# Patient Record
Sex: Male | Born: 1960 | Race: Asian | Hispanic: No | Marital: Married | Smoking: Never smoker
Health system: Southern US, Community
[De-identification: ages and names within clinical notes are randomized; demographics above are authoritative.]

## PROBLEM LIST (undated history)

## (undated) DIAGNOSIS — I10 Essential (primary) hypertension: Secondary | ICD-10-CM

## (undated) DIAGNOSIS — Z87442 Personal history of urinary calculi: Secondary | ICD-10-CM

## (undated) HISTORY — DX: Essential (primary) hypertension: I10

---

## 1984-12-17 HISTORY — PX: APPENDECTOMY: SHX54

## 2017-02-08 LAB — HM COLONOSCOPY

## 2020-04-21 DIAGNOSIS — M25561 Pain in right knee: Secondary | ICD-10-CM | POA: Diagnosis not present

## 2020-04-21 DIAGNOSIS — I10 Essential (primary) hypertension: Secondary | ICD-10-CM | POA: Diagnosis not present

## 2020-09-07 ENCOUNTER — Telehealth: Payer: Self-pay | Admitting: Family Medicine

## 2020-09-07 NOTE — Telephone Encounter (Signed)
Dr. Jyl Heinz is referring Fredin to you as a new patient.   Please advise

## 2020-09-07 NOTE — Telephone Encounter (Signed)
OK to sched, Oct 1 or after. Ty.

## 2020-09-27 ENCOUNTER — Encounter: Payer: Self-pay | Admitting: Family Medicine

## 2020-09-27 ENCOUNTER — Ambulatory Visit: Payer: BC Managed Care – PPO | Admitting: Family Medicine

## 2020-09-27 ENCOUNTER — Other Ambulatory Visit: Payer: Self-pay

## 2020-09-27 VITALS — BP 142/90 | HR 72 | Temp 98.5°F | Ht 71.0 in | Wt 204.5 lb

## 2020-09-27 DIAGNOSIS — I10 Essential (primary) hypertension: Secondary | ICD-10-CM

## 2020-09-27 DIAGNOSIS — Z23 Encounter for immunization: Secondary | ICD-10-CM | POA: Diagnosis not present

## 2020-09-27 MED ORDER — CARVEDILOL 25 MG PO TABS: 25.0000 mg | ORAL_TABLET | Freq: Two times a day (BID) | ORAL | 3 refills | Status: DC

## 2020-09-27 MED ORDER — AMLODIPINE-VALSARTAN-HCTZ 10-320-25 MG PO TABS: 1.0000 | ORAL_TABLET | Freq: Every day | ORAL | 2 refills | Status: DC

## 2020-09-27 NOTE — Patient Instructions (Signed)
Keep the diet clean and stay active.  Around 3 times per week, check your blood pressure 4 times per day. Twice in the morning and twice in the evening. The readings should be at least one minute apart. Write down these values and bring them to your next nurse visit/appointment.  When you check your BP, make sure you have been doing something calm/relaxing 5 minutes prior to checking. Both feet should be flat on the floor and you should be sitting. Use your left arm and make sure it is in a relaxed position (on a table), and that the cuff is at the approximate level/height of your heart.  Aim to do some physical exertion for 150 minutes per week. This is typically divided into 5 days per week, 30 minutes per day. The activity should be enough to get your heart rate up. Anything is better than nothing if you have time constraints.  Let us know if you need anything.

## 2020-09-27 NOTE — Progress Notes (Signed)
Chief Complaint  Patient presents with  . New Patient (Initial Visit)       New Patient Visit SUBJECTIVE: HPI: Roberto Martinez is an 59 y.o.male who is being seen for establishing care.  The patient was previously seen at [facility], doc retired.  Hypertension Patient presents for hypertension follow up. He does not routinely monitor home blood pressures. He is compliant with medications- Exforge HCT 10-320-25 mg/d, Toprol XL 50 mg/d, felodipine 5 mg/d. Patient has these side effects of medication: felodipine made him urinate freq He is adhering to a healthy diet overall. Exercise: walking 1 mi/d  Past Medical History:  Diagnosis Date  . Hypertension    Past Surgical History:  Procedure Laterality Date  . APPENDECTOMY  1986   Family History  Problem Relation Age of Onset  . Early death Mother   . Hypertension Father   . Cancer Sister        breast cancer  . Cancer Brother    No Known Allergies  Current Outpatient Medications:  .  amLODIPine-Valsartan-HCTZ 10-320-25 MG TABS, Take 1 tablet by mouth daily., Disp: 90 tablet, Rfl: 2 .  carvedilol (COREG) 25 MG tablet, Take 1 tablet (25 mg total) by mouth 2 (two) times daily with a meal., Disp: 60 tablet, Rfl: 3  OBJECTIVE: BP (!) 142/90 (BP Location: Right Arm, Patient Position: Sitting, Cuff Size: Normal)   Pulse 72   Temp 98.5 F (36.9 C) (Oral)   Ht 5\' 11"  (1.803 m)   Wt 204 lb 8 oz (92.8 kg)   SpO2 98%   BMI 28.52 kg/m  General:  well developed, well nourished, in no apparent distress Skin:  no significant moles, warts, or growths Lungs:  clear to auscultation, breath sounds equal bilaterally, no respiratory distress Cardio:  regular rate and rhythm, no LE edema or bruits Neuro:  gait normal Psych: well oriented with normal range of affect and appropriate judgment/insight  ASSESSMENT/PLAN: Essential hypertension - Plan: amLODIPine-Valsartan-HCTZ 10-320-25 MG TABS, carvedilol (COREG) 25 MG tablet  Need for influenza  vaccination - Plan: Flu Vaccine QUAD 6+ mos PF IM (Fluarix Quad PF)  Need for Tdap vaccination - Plan: Tdap vaccine greater than or equal to 7yo IM  1. Status: Uncontrolled. Cont Exforge HCT, change Toprol to Coreg 25 mg/d. Stop Plendil 2/2 AE's. Counseled on diet/exercise. Monitor BP at home.  Update imms.  Patient should return in 1 mo for CPE and reck BP. The patient voiced understanding and agreement to the plan.   [place], DO 09/27/20  1:33 PM

## 2020-10-17 ENCOUNTER — Other Ambulatory Visit: Payer: Self-pay | Admitting: Family Medicine

## 2020-10-17 DIAGNOSIS — I10 Essential (primary) hypertension: Secondary | ICD-10-CM

## 2020-10-31 ENCOUNTER — Ambulatory Visit (INDEPENDENT_AMBULATORY_CARE_PROVIDER_SITE_OTHER): Payer: BC Managed Care – PPO | Admitting: Family Medicine

## 2020-10-31 ENCOUNTER — Encounter: Payer: Self-pay | Admitting: Family Medicine

## 2020-10-31 ENCOUNTER — Other Ambulatory Visit: Payer: Self-pay

## 2020-10-31 VITALS — BP 126/68 | HR 67 | Temp 98.4°F | Resp 12 | Ht 71.0 in | Wt 208.4 lb

## 2020-10-31 DIAGNOSIS — Z125 Encounter for screening for malignant neoplasm of prostate: Secondary | ICD-10-CM | POA: Diagnosis not present

## 2020-10-31 DIAGNOSIS — Z0000 Encounter for general adult medical examination without abnormal findings: Secondary | ICD-10-CM

## 2020-10-31 DIAGNOSIS — I10 Essential (primary) hypertension: Secondary | ICD-10-CM | POA: Diagnosis not present

## 2020-10-31 LAB — LIPID PANEL
Cholesterol: 140 mg/dL (ref 0–200)
HDL: 40.9 mg/dL (ref >39.00)
LDL Cholesterol: 76 mg/dL (ref 0–99)
NonHDL: 99.49
Total CHOL/HDL Ratio: 3
Triglycerides: 116 mg/dL (ref 0.0–149.0)
VLDL: 23.2 mg/dL (ref 0.0–40.0)

## 2020-10-31 LAB — COMPREHENSIVE METABOLIC PANEL
ALT: 13 U/L (ref 0–53)
AST: 14 U/L (ref 0–37)
Albumin: 4.1 g/dL (ref 3.5–5.2)
Alkaline Phosphatase: 60 U/L (ref 39–117)
BUN: 10 mg/dL (ref 6–23)
CO2: 30 mEq/L (ref 19–32)
Calcium: 8.9 mg/dL (ref 8.4–10.5)
Chloride: 99 mEq/L (ref 96–112)
Creatinine, Ser: 1 mg/dL (ref 0.40–1.50)
GFR: 82.72 mL/min (ref >60.00)
Glucose, Bld: 93 mg/dL (ref 70–99)
Potassium: 3.6 mEq/L (ref 3.5–5.1)
Sodium: 138 mEq/L (ref 135–145)
Total Bilirubin: 0.8 mg/dL (ref 0.2–1.2)
Total Protein: 6.7 g/dL (ref 6.0–8.3)

## 2020-10-31 LAB — PSA: PSA: 0.66 ng/mL (ref 0.10–4.00)

## 2020-10-31 MED ORDER — CARVEDILOL 25 MG PO TABS: 25.0000 mg | ORAL_TABLET | Freq: Two times a day (BID) | ORAL | 2 refills | Status: DC

## 2020-10-31 MED ORDER — AMLODIPINE BESYLATE 5 MG PO TABS: 5.0000 mg | ORAL_TABLET | Freq: Every day | ORAL | 2 refills | Status: DC

## 2020-10-31 MED ORDER — VALSARTAN-HYDROCHLOROTHIAZIDE 160-25 MG PO TABS: 1.0000 | ORAL_TABLET | Freq: Every day | ORAL | 2 refills | Status: DC

## 2020-10-31 NOTE — Patient Instructions (Addendum)
Give [place] 2-3 business days to get the results of your labs back.   Keep the diet clean and stay active.  Consider GoodRx.   Let us know if you need anything.

## 2020-10-31 NOTE — Progress Notes (Signed)
Chief Complaint  Patient presents with  . Annual Exam    Well Male [place] is here for a complete physical.   His last physical was >1 year ago.  Current diet: in general, a "healthy" diet.  Current exercise: walking Weight trend: stable Fatigue out of ordinary? No. Seat belt? Yes.    Health maintenance Shingrix- Yes Colonoscopy- Yes Tetanus- Yes HIV- No Hep C- No   Past Medical History:  Diagnosis Date  . Hypertension       Past Surgical History:  Procedure Laterality Date  . APPENDECTOMY  1986    Medications  Current Outpatient Medications on File Prior to Visit  Medication Sig Dispense Refill  . carvedilol (COREG) 25 MG tablet Take 1 tablet (25 mg total) by mouth 2 (two) times daily with a meal. 60 tablet 3  . felodipine (PLENDIL) 5 MG 24 hr tablet Take 1 tablet by mouth daily.    . valsartan-hydrochlorothiazide (DIOVAN-HCT) 160-25 MG tablet Take 1 tablet by mouth daily. 30 tablet 0   Allergies No Known Allergies  Family History Family History  Problem Relation Age of Onset  . Early death Mother   . Hypertension Father   . Cancer Sister        breast cancer  . Cancer Brother     Review of Systems: Constitutional:  no fevers Eye:  no recent significant change in vision Ear/Nose/Mouth/Throat:  Ears:  no hearing loss Nose/Mouth/Throat:  no complaints of nasal congestion, no sore throat Cardiovascular:  no chest pain Respiratory:  no shortness of breath Gastrointestinal:  no change in bowel habits GU:  Male: negative for dysuria, frequency Musculoskeletal/Extremities:  no joint pain Integumentary (Skin/Breast):  no abnormal skin lesions reported Neurologic:  no headaches Endocrine: No unexpected weight changes Hematologic/Lymphatic:  no abnormal bleeding  Exam BP 126/68 (BP Location: Left Arm, Cuff Size: Large)   Pulse 67   Temp 98.4 F (36.9 C) (Oral)   Resp 12   Ht 5\' 11"  (1.803 m)   Wt 208 lb 6.4 oz (94.5 kg)   SpO2 97%   BMI 29.07  kg/m  General:  well developed, well nourished, in no apparent distress Skin:  no significant moles, warts, or growths Head:  no masses, lesions, or tenderness Eyes:  pupils equal and round, sclera anicteric without injection Ears:  canals without lesions, TMs shiny without retraction, no obvious effusion, no erythema Nose:  nares patent, septum midline, mucosa normal Throat/Pharynx:  lips and gingiva without lesion; tongue and uvula midline; non-inflamed pharynx; no exudates or postnasal drainage Neck: neck supple without adenopathy, thyromegaly, or masses Cardiac: RRR, no bruits, no LE edema Lungs:  clear to auscultation, breath sounds equal bilaterally, no respiratory distress Rectal: Deferred Musculoskeletal:  symmetrical muscle groups noted without atrophy or deformity Neuro:  gait normal; deep tendon reflexes normal and symmetric Psych: well oriented with normal range of affect and appropriate judgment/insight  Assessment and Plan  Well adult exam - Plan: Comprehensive metabolic panel, Lipid panel  Essential hypertension - Plan: valsartan-hydrochlorothiazide (DIOVAN-HCT) 160-25 MG tablet, carvedilol (COREG) 25 MG tablet  Screening for prostate cancer - Plan: PSA   Well 59 y.o. male. Counseled on diet and exercise. Counseled on risks and benefits of prostate cancer screening with PSA. The patient agrees to undergo testing. Immunizations, labs, and further orders as above. Follow up in 6 mo or prn. The patient voiced understanding and agreement to the plan.  [place], DO 10/31/20 9:25 AM

## 2021-05-01 ENCOUNTER — Ambulatory Visit: Payer: BC Managed Care – PPO | Admitting: Family Medicine

## 2021-05-01 ENCOUNTER — Encounter: Payer: Self-pay | Admitting: Family Medicine

## 2021-05-01 ENCOUNTER — Other Ambulatory Visit: Payer: Self-pay

## 2021-05-01 VITALS — BP 148/60 | HR 58 | Temp 98.0°F | Ht 71.0 in | Wt 206.2 lb

## 2021-05-01 DIAGNOSIS — M25561 Pain in right knee: Secondary | ICD-10-CM

## 2021-05-01 DIAGNOSIS — G8929 Other chronic pain: Secondary | ICD-10-CM

## 2021-05-01 DIAGNOSIS — M25562 Pain in left knee: Secondary | ICD-10-CM | POA: Diagnosis not present

## 2021-05-01 DIAGNOSIS — I10 Essential (primary) hypertension: Secondary | ICD-10-CM | POA: Diagnosis not present

## 2021-05-01 MED ORDER — AMLODIPINE BESYLATE 10 MG PO TABS
10.0000 mg | ORAL_TABLET | Freq: Every day | ORAL | 2 refills | Status: DC
Start: 2021-05-01 — End: 2021-05-10

## 2021-05-01 NOTE — Patient Instructions (Addendum)
Take 2 tabs of amlodipine until you run out. A new dosage has been sent.   Keep the diet clean and stay active.  Check your blood pressures 2-3 times per week, alternating the time of day you check it. If it is high, considering waiting 1-2 minutes and rechecking. If it gets higher, your anxiety is likely creeping up and we should avoid rechecking.   Ice/cold pack over area for 10-15 min twice daily.  Heat (pad or rice pillow in microwave) over affected area, 10-15 minutes twice daily.   OK to take Tylenol 1000 mg (2 extra strength tabs) or 975 mg (3 regular strength tabs) every 6 hours as needed.  Consider Powerstep insoles. There are very quality over the counter inserts. Shop around online and in stores. Dr. Felicie Morn is a cheaper alternative, though is not as high of quality.   Let us know if you need anything.  Knee Exercises It is normal to feel mild stretching, pulling, tightness, or discomfort as you do these exercises, but you should stop right away if you feel sudden pain or your pain gets worse. STRETCHING AND RANGE OF MOTION EXERCISES  These exercises warm up your muscles and joints and improve the movement and flexibility of your knee. These exercises also help to relieve pain, numbness, and tingling. Exercise A: Knee Extension, Prone  1. Lie on your abdomen on a bed. 2. Place your left / right knee just beyond the edge of the surface so your knee is not on the bed. You can put a towel under your left / right thigh just above your knee for comfort. 3. Relax your leg muscles and allow gravity to straighten your knee. You should feel a stretch behind your left / right knee. 4. Hold this position for 30 seconds. 5. Scoot up so your knee is supported between repetitions. Repeat 2 times. Complete this stretch 3 times per week. Exercise B: Knee Flexion, Active    1. Lie on your back with both knees straight. If this causes back discomfort, bend your left / right knee so your  foot is flat on the floor. 2. Slowly slide your left / right heel back toward your buttocks until you feel a gentle stretch in the front of your knee or thigh. 3. Hold this position for 30 seconds. 4. Slowly slide your left / right heel back to the starting position. Repeat 2 times. Complete this exercise 3 times per week. Exercise C: Quadriceps, Prone    1. Lie on your abdomen on a firm surface, such as a bed or padded floor. 2. Bend your left / right knee and hold your ankle. If you cannot reach your ankle or pant leg, loop a belt around your foot and grab the belt instead. 3. Gently pull your heel toward your buttocks. Your knee should not slide out to the side. You should feel a stretch in the front of your thigh and knee. 4. Hold this position for 30 seconds. Repeat 2 times. Complete this stretch 3 times per week. Exercise D: Hamstring, Supine  1. Lie on your back. 2. Loop a belt or towel over the ball of your left / right foot. The ball of your foot is on the walking surface, right under your toes. 3. Straighten your left / right knee and slowly pull on the belt to raise your leg until you feel a gentle stretch behind your knee. ? Do not let your left / right knee bend while you do  this. ? Keep your other leg flat on the floor. 4. Hold this position for 30 seconds. Repeat 2 times. Complete this stretch 3 times per week. STRENGTHENING EXERCISES  These exercises build strength and endurance in your knee. Endurance is the ability to use your muscles for a long time, even after they get tired. Exercise E: Quadriceps, Isometric    1. Lie on your back with your left / right leg extended and your other knee bent. Put a rolled towel or small pillow under your knee if told by your health care provider. 2. Slowly tense the muscles in the front of your left / right thigh. You should see your kneecap slide up toward your hip or see increased dimpling just above the knee. This motion will push  the back of the knee toward the floor. 3. For 3 seconds, keep the muscle as tight as you can without increasing your pain. 4. Relax the muscles slowly and completely. Repeat for 10 total reps Repeat 2 ti mes. Complete this exercise 3 times per week. Exercise F: Straight Leg Raises - Quadriceps  1. Lie on your back with your left / right leg extended and your other knee bent. 2. Tense the muscles in the front of your left / right thigh. You should see your kneecap slide up or see increased dimpling just above the knee. Your thigh may even shake a bit. 3. Keep these muscles tight as you raise your leg 4-6 inches (10-15 cm) off the floor. Do not let your knee bend. 4. Hold this position for 3 seconds. 5. Keep these muscles tense as you lower your leg. 6. Relax your muscles slowly and completely after each repetition. 10 total reps. Repeat 2 times. Complete this exercise 3 times per week.  Exercise G: Hamstring Curls    If told by your health care provider, do this exercise while wearing ankle weights. Begin with 5 lb weights (optional). Then increase the weight by 1 lb (0.5 kg) increments. Do not wear ankle weights that are more than 20 lbs to start with. 1. Lie on your abdomen with your legs straight. 2. Bend your left / right knee as far as you can without feeling pain. Keep your hips flat against the floor. 3. Hold this position for 3 seconds. 4. Slowly lower your leg to the starting position. Repeat for 10 reps.  Repeat 2 times. Complete this exercise 3 times per week. Exercise H: Squats (Quadriceps)  1. Stand in front of a table, with your feet and knees pointing straight ahead. You may rest your hands on the table for balance but not for support. 2. Slowly bend your knees and lower your hips like you are going to sit in a chair. ? Keep your weight over your heels, not over your toes. ? Keep your lower legs upright so they are parallel with the table legs. ? Do not let your hips go  lower than your knees. ? Do not bend lower than told by your health care provider. ? If your knee pain increases, do not bend as low. 3. Hold the squat position for 1 second. 4. Slowly push with your legs to return to standing. Do not use your hands to pull yourself to standing. Repeat 2 times. Complete this exercise 3 times per week. Exercise I: Wall Slides (Quadriceps)    1. Lean your back against a smooth wall or door while you walk your feet out 18-24 inches (46-61 cm) from it. 2. Place  your feet hip-width apart. 3. Slowly slide down the wall or door until your knees Repeat 2 times. Complete this exercise every other day. 4. Exercise K: Straight Leg Raises - Hip Abductors  1. Lie on your side with your left / right leg in the top position. Lie so your head, shoulder, knee, and hip line up. You may bend your bottom knee to help you keep your balance. 2. Roll your hips slightly forward so your hips are stacked directly over each other and your left / right knee is facing forward. 3. Leading with your heel, lift your top leg 4-6 inches (10-15 cm). You should feel the muscles in your outer hip lifting. ? Do not let your foot drift forward. ? Do not let your knee roll toward the ceiling. 4. Hold this position for 3 seconds. 5. Slowly return your leg to the starting position. 6. Let your muscles relax completely after each repetition. 10 total reps. Repeat 2 times. Complete this exercise 3 times per week. Exercise J: Straight Leg Raises - Hip Extensors  1. Lie on your abdomen on a firm surface. You can put a pillow under your hips if that is more comfortable. 2. Tense the muscles in your buttocks and lift your left / right leg about 4-6 inches (10-15 cm). Keep your knee straight as you lift your leg. 3. Hold this position for 3 seconds. 4. Slowly lower your leg to the starting position. 5. Let your leg relax completely after each repetition. Repeat 2 times. Complete this exercise 3 times  per week. Document Released: 10/17/2005 Document Revised: 08/27/2016 Document Reviewed: 10/09/2015 Elsevier Interactive Patient Education  2017 [employer].

## 2021-05-01 NOTE — Progress Notes (Signed)
Chief Complaint  Patient presents with  . Medical Management of Chronic Issues    6 m f/u - leg and knee pain getting worse     Subjective Roberto Martinez is a 60 y.o. male who presents for hypertension follow up. He does monitor home blood pressures. Blood pressures ranging from 140's/80's on average. He is compliant with medications- Norvasc 5 mg/d, Coreg 25 mg bid, Diovan-HCT 160-25 mg/d. Patient has these side effects of medication: none He is not adhering to a healthy diet overall. Current exercise: walking No CP or SOB.   Bilateral knee pain 4-5 mo of bilateral knee pain. No inj or change in activity. Sometimes it will get caught/lock. No neuro s/s's. Has not tried anything thus far. Dad had OA in his knees. It will sometimes swell, no redness or bruising.    Past Medical History:  Diagnosis Date  . Hypertension     Exam BP (!) 148/60   Pulse (!) 58   Temp 98 F (36.7 C) (Oral)   Ht 5\' 11"  (1.803 m)   Wt 206 lb 3.2 oz (93.5 kg)   SpO2 97%   BMI 28.76 kg/m  General:  well developed, well nourished, in no apparent distress Heart: RRR, no bruits, no LE edema Lungs: clear to auscultation, no accessory muscle use MSK: no ttp of knees, no effusion or deformity; neg McMurray's, pat app/grind, Lachman's, Stine's, varus/valgus stress b/l.  Psych: well oriented with normal range of affect and appropriate judgment/insight  Essential hypertension  Chronic pain of both knees  1.  Chronic, uncontrolled.  Increase dosage of Norvasc from 5 mg daily to 10 mg daily.  Continue Coreg 25 mg twice daily and Diovan-HCT 160-25 mg daily.  Continue to monitor blood pressure at home.  Counseled on diet and exercise.  I will see him in 6 weeks for this. 2.  Chronic, uncontrolled.  He has flat feet.  Recommended arch support.  If no improvement in the next few weeks, he will start stretches and exercises to rehab both knees.  We will consider injections versus referral to sports medicine in 6  weeks if no improvement. The patient voiced understanding and agreement to the plan.  [place], DO 05/01/21  4:33 PM

## 2021-05-07 ENCOUNTER — Encounter (HOSPITAL_BASED_OUTPATIENT_CLINIC_OR_DEPARTMENT_OTHER): Payer: Self-pay | Admitting: Emergency Medicine

## 2021-05-07 ENCOUNTER — Emergency Department (HOSPITAL_BASED_OUTPATIENT_CLINIC_OR_DEPARTMENT_OTHER): Payer: BC Managed Care – PPO

## 2021-05-07 ENCOUNTER — Emergency Department (HOSPITAL_BASED_OUTPATIENT_CLINIC_OR_DEPARTMENT_OTHER)
Admission: EM | Admit: 2021-05-07 | Discharge: 2021-05-07 | Disposition: A | Discharge status: Home / Self Care | Payer: BC Managed Care – PPO | Attending: Emergency Medicine | Admitting: Emergency Medicine

## 2021-05-07 ENCOUNTER — Other Ambulatory Visit: Payer: Self-pay

## 2021-05-07 DIAGNOSIS — N400 Enlarged prostate without lower urinary tract symptoms: Secondary | ICD-10-CM | POA: Diagnosis not present

## 2021-05-07 DIAGNOSIS — I10 Essential (primary) hypertension: Secondary | ICD-10-CM | POA: Diagnosis not present

## 2021-05-07 DIAGNOSIS — K3189 Other diseases of stomach and duodenum: Secondary | ICD-10-CM | POA: Diagnosis not present

## 2021-05-07 DIAGNOSIS — R9431 Abnormal electrocardiogram [ECG] [EKG]: Secondary | ICD-10-CM

## 2021-05-07 DIAGNOSIS — R011 Cardiac murmur, unspecified: Secondary | ICD-10-CM | POA: Diagnosis not present

## 2021-05-07 DIAGNOSIS — R112 Nausea with vomiting, unspecified: Secondary | ICD-10-CM | POA: Insufficient documentation

## 2021-05-07 DIAGNOSIS — Z79899 Other long term (current) drug therapy: Secondary | ICD-10-CM | POA: Diagnosis not present

## 2021-05-07 DIAGNOSIS — R1031 Right lower quadrant pain: Secondary | ICD-10-CM

## 2021-05-07 DIAGNOSIS — N289 Disorder of kidney and ureter, unspecified: Secondary | ICD-10-CM | POA: Insufficient documentation

## 2021-05-07 DIAGNOSIS — R109 Unspecified abdominal pain: Secondary | ICD-10-CM | POA: Diagnosis not present

## 2021-05-07 DIAGNOSIS — N2889 Other specified disorders of kidney and ureter: Secondary | ICD-10-CM

## 2021-05-07 DIAGNOSIS — N401 Enlarged prostate with lower urinary tract symptoms: Secondary | ICD-10-CM | POA: Diagnosis not present

## 2021-05-07 DIAGNOSIS — K6389 Other specified diseases of intestine: Secondary | ICD-10-CM | POA: Diagnosis not present

## 2021-05-07 LAB — COMPREHENSIVE METABOLIC PANEL
ALT: 22 U/L (ref 0–44)
AST: 21 U/L (ref 15–41)
Albumin: 4.5 g/dL (ref 3.5–5.0)
Alkaline Phosphatase: 70 U/L (ref 38–126)
Anion gap: 12 (ref 5–15)
BUN: 9 mg/dL (ref 6–20)
CO2: 27 mmol/L (ref 22–32)
Calcium: 9.6 mg/dL (ref 8.9–10.3)
Chloride: 97 mmol/L — ABNORMAL LOW (ref 98–111)
Creatinine, Ser: 0.89 mg/dL (ref 0.61–1.24)
GFR, Estimated: >60 mL/min (ref >60)
Glucose, Bld: 124 mg/dL — ABNORMAL HIGH (ref 70–99)
Potassium: 2.9 mmol/L — ABNORMAL LOW (ref 3.5–5.1)
Sodium: 136 mmol/L (ref 135–145)
Total Bilirubin: 1.4 mg/dL — ABNORMAL HIGH (ref 0.3–1.2)
Total Protein: 8.4 g/dL — ABNORMAL HIGH (ref 6.5–8.1)

## 2021-05-07 LAB — URINALYSIS, ROUTINE W REFLEX MICROSCOPIC
Bilirubin Urine: NEGATIVE
Glucose, UA: NEGATIVE mg/dL
Hgb urine dipstick: NEGATIVE
Ketones, ur: NEGATIVE mg/dL
Leukocytes,Ua: NEGATIVE
Nitrite: NEGATIVE
Protein, ur: NEGATIVE mg/dL
Specific Gravity, Urine: 1.02 (ref 1.005–1.030)
pH: 7.5 (ref 5.0–8.0)

## 2021-05-07 LAB — CBC
HCT: 46.1 % (ref 39.0–52.0)
Hemoglobin: 14.6 g/dL (ref 13.0–17.0)
MCH: 24 pg — ABNORMAL LOW (ref 26.0–34.0)
MCHC: 31.7 g/dL (ref 30.0–36.0)
MCV: 75.8 fL — ABNORMAL LOW (ref 80.0–100.0)
Platelets: 281 10*3/uL (ref 150–400)
RBC: 6.08 MIL/uL — ABNORMAL HIGH (ref 4.22–5.81)
RDW: 16.8 % — ABNORMAL HIGH (ref 11.5–15.5)
WBC: 13 10*3/uL — ABNORMAL HIGH (ref 4.0–10.5)
nRBC: 0 % (ref 0.0–0.2)

## 2021-05-07 LAB — LIPASE, BLOOD: Lipase: 34 U/L (ref 11–51)

## 2021-05-07 LAB — MAGNESIUM: Magnesium: 2.1 mg/dL (ref 1.7–2.4)

## 2021-05-07 MED ORDER — POTASSIUM CHLORIDE CRYS ER 20 MEQ PO TBCR
40.0000 meq | EXTENDED_RELEASE_TABLET | Freq: Once | ORAL | Status: AC
  Administered 2021-05-07: 40 meq via ORAL
  Filled 2021-05-07: qty 2

## 2021-05-07 MED ORDER — ONDANSETRON 4 MG PO TBDP: 4.0000 mg | ORAL_TABLET | Freq: Three times a day (TID) | ORAL | 0 refills | Status: DC | PRN

## 2021-05-07 MED ORDER — ONDANSETRON HCL 4 MG/2ML IJ SOLN
4.0000 mg | Freq: Once | INTRAMUSCULAR | Status: AC
  Administered 2021-05-07: 4 mg via INTRAVENOUS
  Filled 2021-05-07: qty 2

## 2021-05-07 MED ORDER — SODIUM CHLORIDE 0.9 % IV BOLUS
500.0000 mL | Freq: Once | INTRAVENOUS | Status: AC
  Administered 2021-05-07: 500 mL via INTRAVENOUS

## 2021-05-07 MED ORDER — MORPHINE SULFATE (PF) 4 MG/ML IV SOLN
4.0000 mg | Freq: Once | INTRAVENOUS | Status: AC
Start: 2021-05-07 — End: 2021-05-07
  Administered 2021-05-07: 4 mg via INTRAVENOUS
  Filled 2021-05-07: qty 1

## 2021-05-07 MED ORDER — POTASSIUM CHLORIDE ER 10 MEQ PO TBCR: 20.0000 meq | EXTENDED_RELEASE_TABLET | Freq: Every day | ORAL | 0 refills | Status: DC

## 2021-05-07 MED ORDER — POTASSIUM CHLORIDE 10 MEQ/100ML IV SOLN
10.0000 meq | Freq: Once | INTRAVENOUS | Status: AC
  Administered 2021-05-07: 10 meq via INTRAVENOUS
  Filled 2021-05-07: qty 100

## 2021-05-07 MED ORDER — DICYCLOMINE HCL 20 MG PO TABS: 20.0000 mg | ORAL_TABLET | Freq: Two times a day (BID) | ORAL | 0 refills | Status: DC

## 2021-05-07 NOTE — Discharge Instructions (Addendum)
Your work-up today was quite reassuring.  Notably your potassium was somewhat low with a number of 2.9 today.  I have given you 2 days of potassium to take.  You will need to schedule a follow-up appointment with your primary care provider later this week to recheck your electrolytes.  On my examination I did hear a small murmur.  It is reasonable to follow-up with your cardiologist regarding this.  Alternatively your primary care provider may be able to order this ultrasound during your follow-up appointment.  Which can then be reviewed by cardiology.  As we discussed there is a small lesion on your right kidney which will need follow-up by your primary care provider as well.  I have attached the printout of your radiology report for your review and for your primary care provider to review.  Lastly, your EKG was initially somewhat abnormal however the repeat EKG was reassuring. Ultimately I suspect this is unrelated to the symptoms you are experiencing today but does merit discussion with your cardiologist.   For your symptoms today given that you have had a significant improvement with the medicine here and I have prescribed you Bentyl for pain, Zofran for nausea and the potassium we discussed earlier.

## 2021-05-07 NOTE — ED Notes (Signed)
Delivered EKG to Dr. Melina Copa at 1524.

## 2021-05-07 NOTE — ED Provider Notes (Signed)
[facility] Provider Note   CSN: [id number] Arrival date & time: 05/07/21  1419     History Chief Complaint  Patient presents with  . Abdominal Pain    Roberto Martinez is a 60 y.o. male.  HPI Patient is a 60 year old male with past medical history significant for hypertension on medication, past surgical history notable for appendectomy.  Patient is presented today with nausea, vomiting, abdominal pain.  He states that his symptoms began this morning.  He states that he woke up without any pain and got up and walked around start having some achy right lower quadrant abdominal pain.  He states he went to the bathroom and had a normal bowel movement with no blood melena or hematochezia.  He states that he started having worse pain as the day progressed.  An hour or 2 after the pain began he had 1 episode of nonbloody nonbilious emesis.  Since that time he has had progressively worsening abdominal pain.  No chest pain or shortness of breath.  He denies any cough congestion fevers or chills.  Denies any urinary frequency urgency or dysuria.  He states he has had a kidney stone in the past that felt somewhat similar to this.  He denies any other associate symptoms.  No aggravating mitigating factors.      Past Medical History:  Diagnosis Date  . Hypertension     There are no problems to display for this patient.   Past Surgical History:  Procedure Laterality Date  . APPENDECTOMY  1986       Family History  Problem Relation Age of Onset  . Early death Mother   . Hypertension Father   . Cancer Sister        breast cancer  . Cancer Brother     Social History   Tobacco Use  . Smoking status: Never Smoker  . Smokeless tobacco: Never Used  Substance Use Topics  . Alcohol use: Never  . Drug use: Never    Home Medications Prior to Admission medications   Medication Sig Start Date End Date Taking? Authorizing Provider  dicyclomine  (BENTYL) 20 MG tablet Take 1 tablet (20 mg total) by mouth 2 (two) times daily. 05/07/21  Yes Makayle Krahn S, PA  ondansetron (ZOFRAN ODT) 4 MG disintegrating tablet Take 1 tablet (4 mg total) by mouth every 8 (eight) hours as needed for nausea or vomiting. 05/07/21  Yes Zoey Gilkeson S, PA  potassium chloride (KLOR-CON) 10 MEQ tablet Take 2 tablets (20 mEq total) by mouth daily. 05/07/21  Yes Jerrold Haskell S, PA  amLODipine (NORVASC) 10 MG tablet Take 1 tablet (10 mg total) by mouth daily. 05/01/21   Shelda Pal, DO  carvedilol (COREG) 25 MG tablet Take 1 tablet (25 mg total) by mouth 2 (two) times daily with a meal. 10/31/20   Wendling, Crosby Oyster, DO  cyanocobalamin 1000 MCG tablet Take 1 tablet by mouth daily. 11/06/19   [provider]  valsartan-hydrochlorothiazide (DIOVAN-HCT) 160-25 MG tablet Take 1 tablet by mouth daily. 10/31/20   Shelda Pal, DO    Allergies    Patient has no known allergies.  Review of Systems   Review of Systems  Constitutional: Positive for fatigue. Negative for chills and fever.  HENT: Negative for congestion.   Eyes: Negative for pain.  Respiratory: Negative for cough and shortness of breath.   Cardiovascular: Negative for chest pain and leg swelling.  Gastrointestinal: Positive for abdominal pain, nausea  and vomiting. Negative for diarrhea.  Genitourinary: Negative for dysuria.  Musculoskeletal: Negative for myalgias.  Skin: Negative for rash.  Neurological: Negative for dizziness and headaches.    Physical Exam Updated Vital Signs BP 136/82 (BP Location: Right Arm)   Pulse 64   Temp 98 F (36.7 C) (Oral)   Resp (!) 22   Ht 5\' 11"  (1.803 m)   Wt 93 kg   SpO2 99%   BMI 28.59 kg/m   Physical Exam Vitals and nursing note reviewed.  Constitutional:      General: He is not in acute distress.    Comments: Pleasant well-appearing 60 year old.  Uncomfortable appearing.  Able answer questions appropriately follow  commands. No increased work of breathing. Speaking in full sentences.  HENT:     Head: Normocephalic and atraumatic.     Nose: Nose normal.  Eyes:     General: No scleral icterus. Cardiovascular:     Rate and Rhythm: Normal rate and regular rhythm.     Pulses: Normal pulses.     Heart sounds: Murmur heard.    Pulmonary:     Effort: Pulmonary effort is normal. No respiratory distress.     Breath sounds: Normal breath sounds. No wheezing.  Abdominal:     Palpations: Abdomen is soft.     Tenderness: There is abdominal tenderness.     Comments: Abdomen is soft, tenderness to palpation of right lower quadrant.  No other focal abdominal tenderness.  No guarding or rebound.  No CVA tenderness.  No masses palpable.  Musculoskeletal:     Cervical back: Normal range of motion.     Right lower leg: No edema.     Left lower leg: No edema.  Skin:    General: Skin is warm and dry.     Capillary Refill: Capillary refill takes less than 2 seconds.  Neurological:     Mental Status: He is alert. Mental status is at baseline.  Psychiatric:        Mood and Affect: Mood normal.        Behavior: Behavior normal.     ED Results / Procedures / Treatments   Labs (all labs ordered are listed, but only abnormal results are displayed) Labs Reviewed  COMPREHENSIVE METABOLIC PANEL - Abnormal; Notable for the following components:      Result Value   Potassium 2.9 (*)    Chloride 97 (*)    Glucose, Bld 124 (*)    Total Protein 8.4 (*)    Total Bilirubin 1.4 (*)    All other components within normal limits  CBC - Abnormal; Notable for the following components:   WBC 13.0 (*)    RBC 6.08 (*)    MCV 75.8 (*)    MCH 24.0 (*)    RDW 16.8 (*)    All other components within normal limits  LIPASE, BLOOD  URINALYSIS, ROUTINE W REFLEX MICROSCOPIC  MAGNESIUM    EKG EKG Interpretation  Date/Time:  Sunday May 07 2021 15:25:13 EDT Ventricular Rate:  61 PR Interval:  174 QRS Duration: 104 QT  Interval:  419 QTC Calculation: 422 R Axis:   7 Text Interpretation: Sinus rhythm Atrial premature complex Abnormal R-wave progression, early transition No significant change since Confirmed by Deno Etienne [phone]) on 05/07/2021 3:52:57 PM Also confirmed by Deno Etienne [phone]), editor Hattie Perch [phone])  on 05/07/2021 4:41:01 PM   Radiology CT ABDOMEN PELVIS WO CONTRAST  Result Date: 05/07/2021 CLINICAL DATA:  60 year old male with acute RIGHT  abdominal and pelvic pain. EXAM: CT ABDOMEN AND PELVIS WITHOUT CONTRAST TECHNIQUE: Multidetector CT imaging of the abdomen and pelvis was performed following the standard protocol without IV contrast. COMPARISON:  None. FINDINGS: Please note that parenchymal abnormalities may be missed without intravenous contrast. Lower chest: No acute abnormality. Hepatobiliary: Mild hepatic steatosis is noted. No focal hepatic abnormalities are identified. The gallbladder is unremarkable. No biliary dilatation. Pancreas: Unremarkable Spleen: Unremarkable Adrenals/Urinary Tract: A 2 cm mass extending off of the anterior mid-upper RIGHT kidney measures 30 field Hounsfield units and suspicious for solid mass (series 2: Image 32). No other renal abnormalities are identified. No hydronephrosis or urinary calculi identified. The adrenal glands and bladder are unremarkable. Stomach/Bowel: There are multiple dilated proximal and mid small bowel loops. Distal small bowel loops are less distended but there is no definite transition point noted. No bowel wall thickening is noted. Vascular/Lymphatic: No significant vascular findings are present. No enlarged abdominal or pelvic lymph nodes. Reproductive: Mild prostate enlargement noted. Other: No ascites, pneumoperitoneum or focal collection. Musculoskeletal: No acute or suspicious bony abnormalities noted. IMPRESSION: 1. Dilated proximal and mid small bowel loops with less distended distal small bowel loops, without definite transition  point. This may represent a small bowel obstruction versus ileus. No evidence of pneumoperitoneum or focal collection. Follow-up recommended. 2. 2 cm RIGHT renal mass suspicious for solid mass. Recommend elective MRI with and without contrast for further evaluation. 3. Mild hepatic steatosis. 4. Mild prostate enlargement. Electronically Signed   By: Margarette Canada M.D.   On: 05/07/2021 15:48    Procedures Procedures   Medications Ordered in ED Medications  potassium chloride 10 mEq in 100 mL IVPB (10 mEq Intravenous New Bag/Given 05/07/21 1600)  sodium chloride 0.9 % bolus 500 mL (500 mLs Intravenous New Bag/Given 05/07/21 1556)  morphine 4 MG/ML injection 4 mg (4 mg Intravenous Given 05/07/21 1557)  ondansetron (ZOFRAN) injection 4 mg (4 mg Intravenous Given 05/07/21 1556)  potassium chloride SA (KLOR-CON) CR tablet 40 mEq (40 mEq Oral Given 05/07/21 1618)    ED Course  I have reviewed the triage vital signs and the nursing notes.  Pertinent labs & imaging results that were available during my care of the patient were reviewed by me and considered in my medical decision making (see chart for details).  Clinical Course as of 05/07/21 1645  Sun May 07, 2021  1521 Potassium(!): 2.9 Will replete PO/IV x1 and check mag [WF]  1643 Magnesium within normal limits.  CMP unremarkable apart from hypokalemia which was repleted.  CBC with WBC of 13 with some erythrocytosis likely secondary to dehydration.  Lipase within normal limits doubt pancreatitis.  Urinalysis unremarkable. [WF]  1644 1. Dilated proximal and mid small bowel loops with less distended distal small bowel loops, without definite transition point. This may represent a small bowel obstruction versus ileus. No evidence of pneumoperitoneum or focal collection. Follow-up recommended. 2. 2 cm RIGHT renal mass suspicious for solid mass. Recommend elective MRI with and without contrast for further evaluation. 3. Mild hepatic steatosis. 4. Mild  prostate enlargement.   [WF]  [id number] Had a lengthy discussion with patient about the CT scan result.  Suspect the questionable ileus is due to viral illness.  Patient does not have a SBO given that he has had no difficulty tolerating p.o. and has had a normal bowel movement today.  Suspect viral illness.  Will treat with Zofran and Bentyl and will repeat with potassium p.o. for 2 days and have patient  recheck with PCP.  There is a 2 cm right renal mass suspicious for solid mass concerning for tumor. [WF]    Clinical Course User Index [WF] Tedd Sias, PA   MDM Rules/Calculators/A&P                          Patient is a 60 year old male with past medical history detailed in HPI primarily notable for hypertension.  Here for nausea vomiting and abdominal pain right lower quadrant tenderness to palpation otherwise well-appearing somewhat dehydrated appearing.  Will provide with IV fluids, small dose of morphine, Zofran and repleted with potassium.  On reassessment patient has no abdominal tenderness.  Lab work and CT imaging reviewed above.  Somewhat abnormal EKG initially with questionable AV block however repeat EKG unremarkable with a PAC.  Overall reassuring work-up.  Will discharge home with Zofran and Bentyl and potassium.  Message was sent to patient's PCP given the number of incidental findings given today.  Ultimately I suspect patient has a viral illness which will be self-limited.  Return precautions given.  Final Clinical Impression(s) / ED Diagnoses Final diagnoses:  Renal mass  Right lower quadrant abdominal pain  Abnormal EKG  Non-intractable vomiting with nausea, unspecified vomiting type  Murmur  Prostate hypertrophy    Rx / DC Orders ED Discharge Orders         Ordered    ondansetron (ZOFRAN ODT) 4 MG disintegrating tablet  Every 8 hours PRN        05/07/21 1628    dicyclomine (BENTYL) 20 MG tablet  2 times daily        05/07/21 1628    potassium chloride  (KLOR-CON) 10 MEQ tablet  Daily        05/07/21 1628           Tedd Sias, [place] 05/07/21 [place], [place], DO 05/07/21 1729

## 2021-05-07 NOTE — ED Triage Notes (Signed)
Pt arrives pov with c/o diffused abdominal pain with radiation to back. Pt endorses normal BM today, with hopes that pain would resolved.

## 2021-05-07 NOTE — ED Notes (Signed)
ED PA in to speak with pt

## 2021-05-07 NOTE — ED Notes (Signed)
States he feels much better but appears tired at this time, closing eyes intermittently

## 2021-05-07 NOTE — ED Notes (Signed)
AVS provided to client, also informed of the three Rx sent electronically to his pharmacy selected on the EMR. Opportunity for questions provided.

## 2021-05-10 ENCOUNTER — Ambulatory Visit: Payer: BC Managed Care – PPO | Admitting: Family Medicine

## 2021-05-10 ENCOUNTER — Other Ambulatory Visit: Payer: Self-pay

## 2021-05-10 ENCOUNTER — Encounter: Payer: Self-pay | Admitting: Family Medicine

## 2021-05-10 VITALS — BP 122/72 | HR 56 | Temp 98.0°F | Ht 71.0 in | Wt 206.5 lb

## 2021-05-10 DIAGNOSIS — N2889 Other specified disorders of kidney and ureter: Secondary | ICD-10-CM

## 2021-05-10 DIAGNOSIS — E876 Hypokalemia: Secondary | ICD-10-CM

## 2021-05-10 LAB — BASIC METABOLIC PANEL
BUN: 11 mg/dL (ref 6–23)
CO2: 31 mEq/L (ref 19–32)
Calcium: 9.4 mg/dL (ref 8.4–10.5)
Chloride: 100 mEq/L (ref 96–112)
Creatinine, Ser: 0.81 mg/dL (ref 0.40–1.50)
GFR: 96.54 mL/min (ref >60.00)
Glucose, Bld: 93 mg/dL (ref 70–99)
Potassium: 3.7 mEq/L (ref 3.5–5.1)
Sodium: 139 mEq/L (ref 135–145)

## 2021-05-10 MED ORDER — AMLODIPINE BESYLATE 10 MG PO TABS: 10.0000 mg | ORAL_TABLET | Freq: Every day | ORAL | 2 refills | Status: DC

## 2021-05-10 NOTE — Patient Instructions (Addendum)
Give [place] 2-3 business days to get the results of your labs back.   Keep the diet clean and stay active.  Stay hydrated.  Someone will reach out regarding the MRI for your kidney.  I don't hear any significant heart murmur and I don't think you need to follow up on this for now.   Let us know if you need anything.

## 2021-05-10 NOTE — Progress Notes (Signed)
Chief Complaint  Patient presents with  . Hospitalization Follow-up    Subjective: Patient is a 60 y.o. male here for ER follow-up.  The patient went to the ED on 5/22 for abdominal pain and back pain.  CT scan did show some dilated loops of bowel but he has been stooling normally.  Bowel movements are still normal currently.  He no longer has abdominal pain or flank pain.  CT scan also showed an incidental right renal mass measuring 2 cm.  Follow-up MRI was recommended.  He was also found to have a potassium of 2.9.  He was given 40 mEq of Klor-Con and told to follow-up with PCP.  He is staying hydrated.  No nausea or vomiting.  No blood in his urine.  Past Medical History:  Diagnosis Date  . Hypertension     Objective: BP 122/72 (BP Location: Left Arm, Patient Position: Sitting, Cuff Size: Normal)   Pulse (!) 56   Temp 98 F (36.7 C) (Oral)   Ht 5\' 11"  (1.803 m)   Wt 206 lb 8 oz (93.7 kg)   SpO2 97%   BMI 28.80 kg/m  General: Awake, appears stated age MSK: No CVA tenderness bilaterally Heart: RRR, no lower extremity edema Lungs: CTAB, no rales, wheezes or rhonchi. No accessory muscle use Psych: Age appropriate judgment and insight, normal affect and mood  Assessment and Plan: Right renal mass - Plan: MR Abdomen W Wo Contrast  Hypokalemia - Plan: Basic metabolic panel  1.  New problem, uncertain prognosis.  Check MRI per radiology recommendations.  He is not having any signs or symptoms of renal carcinoma. 2.  Check BMP today. Follow-up as originally scheduled. The patient voiced understanding and agreement to the plan.  [place], DO 05/10/21  11:47 AM

## 2021-05-13 ENCOUNTER — Ambulatory Visit (HOSPITAL_BASED_OUTPATIENT_CLINIC_OR_DEPARTMENT_OTHER)
Admission: RE | Admit: 2021-05-13 | Discharge: 2021-05-13 | Disposition: A | Discharge status: Home / Self Care | Payer: BC Managed Care – PPO | Source: Ambulatory Visit | Attending: Family Medicine | Admitting: Family Medicine

## 2021-05-13 ENCOUNTER — Other Ambulatory Visit: Payer: Self-pay

## 2021-05-13 DIAGNOSIS — N2889 Other specified disorders of kidney and ureter: Secondary | ICD-10-CM | POA: Insufficient documentation

## 2021-05-13 DIAGNOSIS — N281 Cyst of kidney, acquired: Secondary | ICD-10-CM | POA: Diagnosis not present

## 2021-05-13 MED ORDER — GADOBUTROL 1 MMOL/ML IV SOLN
9.4000 mL | Freq: Once | INTRAVENOUS | Status: AC | PRN
  Administered 2021-05-13: 9.4 mL via INTRAVENOUS

## 2021-05-15 ENCOUNTER — Other Ambulatory Visit: Payer: Self-pay | Admitting: Family Medicine

## 2021-05-15 DIAGNOSIS — N2889 Other specified disorders of kidney and ureter: Secondary | ICD-10-CM

## 2021-05-22 DIAGNOSIS — Z125 Encounter for screening for malignant neoplasm of prostate: Secondary | ICD-10-CM | POA: Diagnosis not present

## 2021-05-22 DIAGNOSIS — D4101 Neoplasm of uncertain behavior of right kidney: Secondary | ICD-10-CM | POA: Diagnosis not present

## 2021-05-22 DIAGNOSIS — N400 Enlarged prostate without lower urinary tract symptoms: Secondary | ICD-10-CM | POA: Diagnosis not present

## 2021-05-24 ENCOUNTER — Ambulatory Visit (HOSPITAL_COMMUNITY)
Admission: RE | Admit: 2021-05-24 | Discharge: 2021-05-24 | Disposition: A | Discharge status: Home / Self Care | Payer: BC Managed Care – PPO | Source: Ambulatory Visit | Attending: Urology | Admitting: Urology

## 2021-05-24 ENCOUNTER — Other Ambulatory Visit (HOSPITAL_COMMUNITY): Payer: Self-pay | Admitting: Urology

## 2021-05-24 ENCOUNTER — Other Ambulatory Visit: Payer: Self-pay

## 2021-05-24 DIAGNOSIS — Z01818 Encounter for other preprocedural examination: Secondary | ICD-10-CM | POA: Diagnosis not present

## 2021-05-24 DIAGNOSIS — D4101 Neoplasm of uncertain behavior of right kidney: Secondary | ICD-10-CM | POA: Insufficient documentation

## 2021-06-02 ENCOUNTER — Other Ambulatory Visit: Payer: Self-pay | Admitting: Family Medicine

## 2021-06-02 DIAGNOSIS — R911 Solitary pulmonary nodule: Secondary | ICD-10-CM

## 2021-06-08 DIAGNOSIS — R911 Solitary pulmonary nodule: Secondary | ICD-10-CM | POA: Diagnosis not present

## 2021-06-08 DIAGNOSIS — D49511 Neoplasm of unspecified behavior of right kidney: Secondary | ICD-10-CM | POA: Diagnosis not present

## 2021-06-12 ENCOUNTER — Other Ambulatory Visit: Payer: Self-pay

## 2021-06-12 ENCOUNTER — Encounter: Payer: Self-pay | Admitting: Family Medicine

## 2021-06-12 ENCOUNTER — Ambulatory Visit: Payer: BC Managed Care – PPO | Admitting: Family Medicine

## 2021-06-12 VITALS — BP 132/82 | HR 57 | Temp 98.3°F | Ht 71.0 in | Wt 203.2 lb

## 2021-06-12 DIAGNOSIS — I10 Essential (primary) hypertension: Secondary | ICD-10-CM | POA: Diagnosis not present

## 2021-06-12 MED ORDER — VALSARTAN-HYDROCHLOROTHIAZIDE 320-25 MG PO TABS: 1.0000 | ORAL_TABLET | Freq: Every day | ORAL | 3 refills | Status: DC

## 2021-06-12 NOTE — Progress Notes (Signed)
Chief Complaint  Patient presents with   Follow-up    Subjective Roberto Martinez is a 60 y.o. male who presents for hypertension follow up. He does monitor home blood pressures. Blood pressures ranging from 130-140's/70's on average. He is compliant with medications- Norvasc 10 mg/d, Diovan HCT 160-25 mg/d, Coreg 25 mg/d. Patient has these side effects of medication: none He is adhering to a healthy diet overall. Current exercise: walking No CP or SOB.    Past Medical History:  Diagnosis Date   Hypertension     Exam BP 132/82   Pulse (!) 57   Temp 98.3 F (36.8 C) (Oral)   Ht 5\' 11"  (1.803 m)   Wt 203 lb 4 oz (92.2 kg)   SpO2 98%   BMI 28.35 kg/m  General:  well developed, well nourished, in no apparent distress Heart: RRR, 2/6 systolic ejection murmur heard loudest the aortic listening post with radiation to the carotids, 2+ pitting bilateral LE edema tapering at the proximal third of the tibia Lungs: clear to auscultation, no accessory muscle use Psych: well oriented with normal range of affect and appropriate judgment/insight  Essential hypertension - Plan: Basic Metabolic Panel (BMET), valsartan-hydrochlorothiazide (DIOVAN-HCT) 320-25 MG tablet  Chronic, uncontrolled.  Increase Diovan-HCT from 160-25 mg daily to 320-25 mg daily.  Hold Norvasc 10 mg daily for the next week to address lower extremity swelling.  Elevate legs, mind salt intake.  Consider compression stockings.  Counseled on diet and exercise. F/u in 1 month. The patient voiced understanding and agreement to the plan.  [place], DO 06/12/21  4:48 PM

## 2021-06-12 NOTE — Patient Instructions (Signed)
Stop the amlodipine for now. Send me a message in 1 week whether it helped or not.  For the swelling in your lower extremities, be sure to elevate your legs when able, mind the salt intake, stay physically active and consider wearing compression stockings.  Keep the diet clean and stay active.  Check your blood pressures 2-3 times per week, alternating the time of day you check it. If it is high, considering waiting 1-2 minutes and rechecking. If it gets higher, your anxiety is likely creeping up and we should avoid rechecking.   Give [place] 2-3 business days to get the results of your labs back.   Let us know if you need anything.

## 2021-06-13 ENCOUNTER — Other Ambulatory Visit: Payer: Self-pay | Admitting: Family Medicine

## 2021-06-13 LAB — BASIC METABOLIC PANEL
BUN: 11 mg/dL (ref 6–23)
CO2: 28 mEq/L (ref 19–32)
Calcium: 9.6 mg/dL (ref 8.4–10.5)
Chloride: 99 mEq/L (ref 96–112)
Creatinine, Ser: 0.84 mg/dL (ref 0.40–1.50)
GFR: 95.43 mL/min (ref >60.00)
Glucose, Bld: 98 mg/dL (ref 70–99)
Potassium: 3.2 mEq/L — ABNORMAL LOW (ref 3.5–5.1)
Sodium: 138 mEq/L (ref 135–145)

## 2021-06-13 MED ORDER — POTASSIUM CHLORIDE ER 10 MEQ PO TBCR: 10.0000 meq | EXTENDED_RELEASE_TABLET | Freq: Every day | ORAL | 0 refills | Status: DC

## 2021-06-14 ENCOUNTER — Other Ambulatory Visit: Payer: Self-pay | Admitting: Family Medicine

## 2021-06-14 ENCOUNTER — Ambulatory Visit (INDEPENDENT_AMBULATORY_CARE_PROVIDER_SITE_OTHER): Payer: BC Managed Care – PPO

## 2021-06-14 ENCOUNTER — Other Ambulatory Visit: Payer: Self-pay

## 2021-06-14 DIAGNOSIS — I700 Atherosclerosis of aorta: Secondary | ICD-10-CM | POA: Diagnosis not present

## 2021-06-14 DIAGNOSIS — E876 Hypokalemia: Secondary | ICD-10-CM

## 2021-06-14 DIAGNOSIS — R911 Solitary pulmonary nodule: Secondary | ICD-10-CM | POA: Diagnosis not present

## 2021-06-14 DIAGNOSIS — I10 Essential (primary) hypertension: Secondary | ICD-10-CM

## 2021-06-14 MED ORDER — IOHEXOL 300 MG/ML  SOLN
100.0000 mL | Freq: Once | INTRAMUSCULAR | Status: AC | PRN
  Administered 2021-06-14: 75 mL via INTRAVENOUS

## 2021-06-23 ENCOUNTER — Other Ambulatory Visit (INDEPENDENT_AMBULATORY_CARE_PROVIDER_SITE_OTHER): Payer: BC Managed Care – PPO

## 2021-06-23 ENCOUNTER — Other Ambulatory Visit: Payer: Self-pay

## 2021-06-23 DIAGNOSIS — E876 Hypokalemia: Secondary | ICD-10-CM | POA: Diagnosis not present

## 2021-06-23 LAB — BASIC METABOLIC PANEL
BUN: 10 mg/dL (ref 7–25)
CO2: 30 mmol/L (ref 20–32)
Calcium: 9.2 mg/dL (ref 8.6–10.3)
Chloride: 100 mmol/L (ref 98–110)
Creat: 0.83 mg/dL (ref 0.70–1.33)
Glucose, Bld: 83 mg/dL (ref 65–99)
Potassium: 3.6 mmol/L (ref 3.5–5.3)
Sodium: 138 mmol/L (ref 135–146)

## 2021-06-23 LAB — MAGNESIUM: Magnesium: 2.1 mg/dL (ref 1.5–2.5)

## 2021-06-23 NOTE — Addendum Note (Signed)
Addended by: Manuela Schwartz on: 06/23/2021 03:09 PM   Modules accepted: Orders

## 2021-06-26 ENCOUNTER — Other Ambulatory Visit: Payer: Self-pay | Admitting: Family Medicine

## 2021-06-26 MED ORDER — ROSUVASTATIN CALCIUM 20 MG PO TABS: 20.0000 mg | ORAL_TABLET | Freq: Every day | ORAL | 3 refills | Status: DC

## 2021-06-27 ENCOUNTER — Other Ambulatory Visit: Payer: Self-pay | Admitting: Urology

## 2021-07-04 NOTE — Patient Instructions (Addendum)
DUE TO COVID-19 ONLY ONE VISITOR IS ALLOWED TO COME WITH YOU AND STAY IN THE WAITING ROOM ONLY DURING PRE OP AND PROCEDURE.   **NO VISITORS ARE ALLOWED IN THE SHORT STAY AREA OR RECOVERY ROOM!!**  IF YOU WILL BE ADMITTED INTO THE HOSPITAL YOU ARE ALLOWED ONLY TWO SUPPORT PEOPLE DURING VISITATION HOURS ONLY (10AM -8PM)   The support person(s) may change daily. The support person(s) must pass our screening, gel in and out, and wear a mask at all times, including in the patient's room. Patients must also wear a mask when staff or their support person are in the room.  No visitors under the age of 48. Any visitor under the age of 66 must be accompanied by an adult.    COVID SWAB TESTING MUST BE COMPLETED ON:  07/10/21 @ 10:40 AM   [address] [place], [place] [address]   [place] (backside of the building) You are not required to quarantine, however you are required to wear a well-fitted mask when you are out and around people not in your household.  Hand Hygiene often Do NOT share personal items Notify your provider if you are in close contact with someone who has COVID or you develop fever 100.4 or greater, new onset of sneezing, cough, sore throat, shortness of breath or body aches.   Your procedure is scheduled on: 07/12/21   Report to [facility] Main  Entrance   Report to Short Stay at 5:15 AM   [facility])   Call this number if you have problems the morning of surgery [phone]   Do not eat food :After Midnight.   May have liquids until 4:15 AM day of surgery  CLEAR LIQUID DIET  Foods Allowed                                                                     Foods Excluded  Water, Black Coffee and tea, regular and decaf              liquids that you cannot  Plain Jell-O in any flavor  (No red)                                    see through such as: Fruit ices (not with fruit pulp)                                            milk,  soups, orange juice              Iced Popsicles (No red)                                                All solid food  Apple juices Sports drinks like Gatorade (No red) Lightly seasoned clear broth or consume(fat free) Sugar, honey syrup     Oral Hygiene is also important to reduce your risk of infection.                                    Remember - BRUSH YOUR TEETH THE MORNING OF SURGERY WITH YOUR REGULAR TOOTHPASTE   Take these medicines the morning of surgery with A SIP OF WATER: Carvedilol, Rosuvastatin.                               You may not have any metal on your body including jewelry, and body piercing             Do not wear make-up, lotions, powders, perfumes, or deodorant              Men may shave face and neck.   Do not bring valuables to the hospital. [place].   Bring small overnight bag day of surgery.  Please read over the following fact sheets you were given: IF YOU HAVE QUESTIONS ABOUT YOUR PRE OP INSTRUCTIONS PLEASE CALL [phone]- [facility] - Preparing for Surgery Before surgery, you can play an important role.  Because skin is not sterile, your skin needs to be as free of germs as possible.  You can reduce the number of germs on your skin by washing with CHG (chlorahexidine gluconate) soap before surgery.  CHG is an antiseptic cleaner which kills germs and bonds with the skin to continue killing germs even after washing. Please DO NOT use if you have an allergy to CHG or antibacterial soaps.  If your skin becomes reddened/irritated stop using the CHG and inform your nurse when you arrive at Short Stay. Do not shave (including legs and underarms) for at least 48 hours prior to the first CHG shower.  You may shave your face/neck.  Please follow these instructions carefully:  1.  Shower with CHG Soap the night before surgery and the  morning of surgery.  2.  If you  choose to wash your hair, wash your hair first as usual with your normal  shampoo.  3.  After you shampoo, rinse your hair and body thoroughly to remove the shampoo.                             4.  Use CHG as you would any other liquid soap.  You can apply chg directly to the skin and wash.  Gently with a scrungie or clean washcloth.  5.  Apply the CHG Soap to your body ONLY FROM THE NECK DOWN.   Do   not use on face/ open                           Wound or open sores. Avoid contact with eyes, ears mouth and   genitals (private parts).                       Wash face,  Genitals (private parts) with your normal soap.  6.  Wash thoroughly, paying special attention to the area where your    surgery  will be performed.  7.  Thoroughly rinse your body with warm water from the neck down.  8.  DO NOT shower/wash with your normal soap after using and rinsing off the CHG Soap.                9.  Pat yourself dry with a clean towel.            10.  Wear clean pajamas.            11.  Place clean sheets on your bed the night of your first shower and do not  sleep with pets. Day of Surgery : Do not apply any lotions/deodorants the morning of surgery.  Please wear clean clothes to the hospital/surgery center.  FAILURE TO FOLLOW THESE INSTRUCTIONS MAY RESULT IN THE CANCELLATION OF YOUR SURGERY  PATIENT SIGNATURE_________________________________  NURSE SIGNATURE__________________________________  ________________________________________________________________________  WHAT IS A BLOOD TRANSFUSION? Blood Transfusion Information  A transfusion is the replacement of blood or some of its parts. Blood is made up of multiple cells which provide different functions. Red blood cells carry oxygen and are used for blood loss replacement. White blood cells fight against infection. Platelets control bleeding. Plasma helps clot blood. Other blood products are available for specialized needs, such as  hemophilia or other clotting disorders. BEFORE THE TRANSFUSION  Who gives blood for transfusions?  Healthy volunteers who are fully evaluated to make sure their blood is safe. This is blood bank blood. Transfusion therapy is the safest it has ever been in the practice of medicine. Before blood is taken from a donor, a complete history is taken to make sure that person has no history of diseases nor engages in risky social behavior (examples are intravenous drug use or sexual activity with multiple partners). The donor's travel history is screened to minimize risk of transmitting infections, such as malaria. The donated blood is tested for signs of infectious diseases, such as HIV and hepatitis. The blood is then tested to be sure it is compatible with you in order to minimize the chance of a transfusion reaction. If you or a relative donates blood, this is often done in anticipation of surgery and is not appropriate for emergency situations. It takes many days to process the donated blood. RISKS AND COMPLICATIONS Although transfusion therapy is very safe and saves many lives, the main dangers of transfusion include:  Getting an infectious disease. Developing a transfusion reaction. This is an allergic reaction to something in the blood you were given. Every precaution is taken to prevent this. The decision to have a blood transfusion has been considered carefully by your caregiver before blood is given. Blood is not given unless the benefits outweigh the risks. AFTER THE TRANSFUSION Right after receiving a blood transfusion, you will usually feel much better and more energetic. This is especially true if your red blood cells have gotten low (anemic). The transfusion raises the level of the red blood cells which carry oxygen, and this usually causes an energy increase. The nurse administering the transfusion will monitor you carefully for complications. HOME CARE INSTRUCTIONS  No special instructions  are needed after a transfusion. You may find your energy is better. Speak with your caregiver about any limitations on activity for underlying diseases you may have. SEEK MEDICAL CARE IF:  Your condition is not improving after your transfusion. You develop redness or irritation at the  intravenous (IV) site. SEEK IMMEDIATE MEDICAL CARE IF:  Any of the following symptoms occur over the next 12 hours: Shaking chills. You have a temperature by mouth above 102 F (38.9 C), not controlled by medicine. Chest, back, or muscle pain. People around you feel you are not acting correctly or are confused. Shortness of breath or difficulty breathing. Dizziness and fainting. You get a rash or develop hives. You have a decrease in urine output. Your urine turns a dark color or changes to pink, red, or brown. Any of the following symptoms occur over the next 10 days: You have a temperature by mouth above 102 F (38.9 C), not controlled by medicine. Shortness of breath. Weakness after normal activity. The white part of the eye turns yellow (jaundice). You have a decrease in the amount of urine or are urinating less often. Your urine turns a dark color or changes to pink, red, or brown. Document Released: 11/30/2000 Document Revised: 02/25/2012 Document Reviewed: 07/19/2008 [facility] Patient Information 2014 [place], [place].  _______________________________________________________________________

## 2021-07-04 NOTE — Progress Notes (Addendum)
COVID Vaccine Completed: yes x3 Date COVID Vaccine completed:02/22/20, 03/21/20 Has received booster: 10/29/20 COVID vaccine manufacturer: Moderna     Date of COVID positive in last 73 days:N/A  PCP - Riki Sheer, DO Cardiologist -   Chest x-ray - 05/24/21 Epic EKG - 05/07/21 Epic Stress Test - N/A ECHO - N/A Cardiac Cath - N/A Pacemaker/ICD device last checked N?A Spinal Cord Stimulator: N/A  Sleep Study - N/A CPAP -   Fasting Blood Sugar - N/A Checks Blood Sugar _____ times a day  Blood Thinner Instructions: N/A Aspirin Instructions: Last Dose:  Activity level: Can go up a flight of stairs and perform activities of daily living without stopping and without symptoms of chest pain or shortness of breath.    Anesthesia review: Patient BP at PAT were 195/83, 195/98, and 189/94. Pt states he usually runs 562B or 638L systolic. Reported BP  readings to [facility]. She recommended that the patient check his blood pressure at home and if he gets over 150/90 to  call his PCP and see if they want to make a medication change before surgery. Pt denied any symptoms at PAT appointment.  Patient denies shortness of breath, fever, cough and chest pain at PAT appointment   Patient verbalized understanding of instructions that were given to them at the PAT appointment. Patient was also instructed that they will need to review over the PAT instructions again at home before surgery.

## 2021-07-05 ENCOUNTER — Other Ambulatory Visit: Payer: Self-pay

## 2021-07-05 ENCOUNTER — Encounter (HOSPITAL_COMMUNITY)
Admission: RE | Admit: 2021-07-05 | Discharge: 2021-07-05 | Disposition: A | Discharge status: Home / Self Care | Payer: BC Managed Care – PPO | Source: Ambulatory Visit | Attending: Urology | Admitting: Urology

## 2021-07-05 ENCOUNTER — Encounter (HOSPITAL_COMMUNITY): Payer: Self-pay

## 2021-07-05 DIAGNOSIS — Z01812 Encounter for preprocedural laboratory examination: Secondary | ICD-10-CM | POA: Insufficient documentation

## 2021-07-05 HISTORY — DX: Personal history of urinary calculi: Z87.442

## 2021-07-05 LAB — CBC
HCT: 37.2 % — ABNORMAL LOW (ref 39.0–52.0)
Hemoglobin: 11.8 g/dL — ABNORMAL LOW (ref 13.0–17.0)
MCH: 24.8 pg — ABNORMAL LOW (ref 26.0–34.0)
MCHC: 31.7 g/dL (ref 30.0–36.0)
MCV: 78.3 fL — ABNORMAL LOW (ref 80.0–100.0)
Platelets: 224 10*3/uL (ref 150–400)
RBC: 4.75 MIL/uL (ref 4.22–5.81)
RDW: 16.5 % — ABNORMAL HIGH (ref 11.5–15.5)
WBC: 7.3 10*3/uL (ref 4.0–10.5)
nRBC: 0 % (ref 0.0–0.2)

## 2021-07-10 ENCOUNTER — Other Ambulatory Visit: Payer: Self-pay | Admitting: Family Medicine

## 2021-07-10 ENCOUNTER — Other Ambulatory Visit: Payer: Self-pay

## 2021-07-10 ENCOUNTER — Ambulatory Visit (INDEPENDENT_AMBULATORY_CARE_PROVIDER_SITE_OTHER): Payer: BC Managed Care – PPO | Admitting: Family Medicine

## 2021-07-10 ENCOUNTER — Encounter: Payer: Self-pay | Admitting: Family Medicine

## 2021-07-10 ENCOUNTER — Other Ambulatory Visit (HOSPITAL_COMMUNITY)
Admission: RE | Admit: 2021-07-10 | Discharge: 2021-07-10 | Disposition: A | Discharge status: Home / Self Care | Payer: BC Managed Care – PPO | Source: Ambulatory Visit | Attending: Urology | Admitting: Urology

## 2021-07-10 VITALS — BP 162/80 | HR 57 | Temp 98.6°F | Ht 71.0 in | Wt 199.2 lb

## 2021-07-10 DIAGNOSIS — I10 Essential (primary) hypertension: Secondary | ICD-10-CM | POA: Insufficient documentation

## 2021-07-10 DIAGNOSIS — Z01812 Encounter for preprocedural laboratory examination: Secondary | ICD-10-CM | POA: Insufficient documentation

## 2021-07-10 DIAGNOSIS — Z20822 Contact with and (suspected) exposure to covid-19: Secondary | ICD-10-CM | POA: Insufficient documentation

## 2021-07-10 LAB — SARS CORONAVIRUS 2 (TAT 6-24 HRS): SARS Coronavirus 2: NEGATIVE

## 2021-07-10 MED ORDER — AMLODIPINE BESYLATE 10 MG PO TABS: 5.0000 mg | ORAL_TABLET | Freq: Every day | ORAL | 2 refills | Status: DC

## 2021-07-10 MED ORDER — SPIRONOLACTONE 25 MG PO TABS: 25.0000 mg | ORAL_TABLET | Freq: Every day | ORAL | 3 refills | Status: DC

## 2021-07-10 MED ORDER — ALPRAZOLAM 0.25 MG PO TABS: 0.2500 mg | ORAL_TABLET | Freq: Two times a day (BID) | ORAL | 0 refills | Status: DC | PRN

## 2021-07-10 NOTE — Patient Instructions (Addendum)
Keep the diet clean and stay active.  Keep taking your medicine.   Take 1/2 tab of the amlodipine moving forward.  They will be able monitor labs in the hospital as well.   Let us know if you need anything.

## 2021-07-10 NOTE — Progress Notes (Signed)
Chief Complaint  Patient presents with   Hypertension    Subjective Roberto Martinez is a 60 y.o. male who presents for hypertension follow up. He does monitor home blood pressures. Blood pressures ranging from 170's/90's on average. He is compliant with medications- Coreg 25 mg bid, Diovan-HCT 320-25 mg/d, started taking Norvasc 10 mg/d again. Patient has these side effects of medication: none When he was on Norvasc 10 mg/d, he had LE swelling that resolved upon cessation.  He is usually adhering to a healthy diet overall. Current exercise: walking No CP or SOB.    Past Medical History:  Diagnosis Date   History of kidney stones    Hypertension     Exam BP (!) 162/80   Pulse (!) 57   Temp 98.6 F (37 C) (Oral)   Ht '5\' 11"'$  (1.803 m)   Wt 199 lb 4 oz (90.4 kg)   SpO2 98%   BMI 27.79 kg/m  General:  well developed, well nourished, in no apparent distress Heart: Reg rhythm, bradycardic, no bruits, no LE edema Lungs: clear to auscultation, no accessory muscle use Psych: well oriented with normal range of affect and appropriate judgment/insight  Essential hypertension - Plan: amLODipine (NORVASC) 10 MG tablet, spironolactone (ALDACTONE) 25 MG tablet  Chronic, uncontrolled. Could be component of anxiety given renal mass. Go back on Norvasc 5 mg/d (taking 1/2 tab daily given swelling hx), cont Diovan HCT 320-25 mg, Coreg 25 mg bid. Add Spironolactone 25 mg/d. If he is still having issues, will change HCTZ to chlorthalidone. K reviewed. Counseled on diet and exercise. F/u in 1 mo. The patient voiced understanding and agreement to the plan.  [place], DO 07/10/21  8:16 AM

## 2021-07-11 NOTE — Anesthesia Preprocedure Evaluation (Addendum)
Anesthesia Evaluation  Patient identified by MRN, date of birth, ID band Patient awake    Reviewed: Allergy & Precautions, NPO status , Patient's Chart, lab work & pertinent test results  Airway Mallampati: II  TM Distance: >3 FB Neck ROM: Full    Dental  (+) Dental Advisory Given   Pulmonary neg pulmonary ROS,    breath sounds clear to auscultation       Cardiovascular hypertension, Pt. on medications and Pt. on home beta blockers  Rhythm:Regular Rate:Normal     Neuro/Psych negative neurological ROS     GI/Hepatic negative GI ROS, Neg liver ROS,   Endo/Other  negative endocrine ROS  Renal/GU Right renal mass     Musculoskeletal   Abdominal   Peds  Hematology negative hematology ROS (+) anemia ,   Anesthesia Other Findings   Reproductive/Obstetrics                            Lab Results  Component Value Date   WBC 7.3 07/05/2021   HGB 11.8 (L) 07/05/2021   HCT 37.2 (L) 07/05/2021   MCV 78.3 (L) 07/05/2021   PLT 224 07/05/2021   Lab Results  Component Value Date   CREATININE 0.83 06/23/2021   BUN 10 06/23/2021   NA 138 06/23/2021   K 3.6 06/23/2021   CL 100 06/23/2021   CO2 30 06/23/2021    Anesthesia Physical Anesthesia Plan  ASA: 2  Anesthesia Plan: General   Post-op Pain Management:    Induction: Intravenous  PONV Risk Score and Plan: 2 and Dexamethasone, Ondansetron and Treatment may vary due to age or medical condition  Airway Management Planned: Oral ETT  Additional Equipment:   Intra-op Plan:   Post-operative Plan: Extubation in OR  Informed Consent: I have reviewed the patients History and Physical, chart, labs and discussed the procedure including the risks, benefits and alternatives for the proposed anesthesia with the patient or authorized representative who has indicated his/her understanding and acceptance.     Dental advisory given  Plan  Discussed with: CRNA  Anesthesia Plan Comments: (2IV's. GETA)       Anesthesia Quick Evaluation

## 2021-07-12 ENCOUNTER — Ambulatory Visit (HOSPITAL_COMMUNITY): Payer: BC Managed Care – PPO | Admitting: Certified Registered Nurse Anesthetist

## 2021-07-12 ENCOUNTER — Ambulatory Visit: Payer: BC Managed Care – PPO | Admitting: Family Medicine

## 2021-07-12 ENCOUNTER — Other Ambulatory Visit: Payer: Self-pay

## 2021-07-12 ENCOUNTER — Encounter (HOSPITAL_COMMUNITY): Payer: Self-pay | Admitting: Urology

## 2021-07-12 ENCOUNTER — Ambulatory Visit (HOSPITAL_COMMUNITY): Payer: BC Managed Care – PPO | Admitting: Physician Assistant

## 2021-07-12 ENCOUNTER — Observation Stay (HOSPITAL_COMMUNITY)
Admission: RE | Admit: 2021-07-12 | Discharge: 2021-07-13 | Disposition: A | Discharge status: Home / Self Care | Payer: BC Managed Care – PPO | Attending: Urology | Admitting: Urology

## 2021-07-12 ENCOUNTER — Encounter (HOSPITAL_COMMUNITY)
Admission: RE | Disposition: A | Discharge status: Home / Self Care | Payer: Self-pay | Source: Home / Self Care | Attending: Urology

## 2021-07-12 DIAGNOSIS — I10 Essential (primary) hypertension: Secondary | ICD-10-CM | POA: Insufficient documentation

## 2021-07-12 DIAGNOSIS — N2889 Other specified disorders of kidney and ureter: Secondary | ICD-10-CM | POA: Diagnosis not present

## 2021-07-12 DIAGNOSIS — D649 Anemia, unspecified: Secondary | ICD-10-CM | POA: Diagnosis not present

## 2021-07-12 DIAGNOSIS — C641 Malignant neoplasm of right kidney, except renal pelvis: Secondary | ICD-10-CM | POA: Diagnosis not present

## 2021-07-12 HISTORY — PX: ROBOTIC ASSITED PARTIAL NEPHRECTOMY: SHX6087

## 2021-07-12 LAB — BASIC METABOLIC PANEL
Anion gap: 12 (ref 5–15)
Anion gap: 9 (ref 5–15)
BUN: 10 mg/dL (ref 6–20)
BUN: 11 mg/dL (ref 6–20)
CO2: 27 mmol/L (ref 22–32)
CO2: 28 mmol/L (ref 22–32)
Calcium: 9.8 mg/dL (ref 8.9–10.3)
Calcium: 9.1 mg/dL (ref 8.9–10.3)
Chloride: 100 mmol/L (ref 98–111)
Chloride: 101 mmol/L (ref 98–111)
Creatinine, Ser: 0.67 mg/dL (ref 0.61–1.24)
Creatinine, Ser: 0.83 mg/dL (ref 0.61–1.24)
GFR, Estimated: >60 mL/min (ref >60)
GFR, Estimated: >60 mL/min (ref >60)
Glucose, Bld: 97 mg/dL (ref 70–99)
Glucose, Bld: 133 mg/dL — ABNORMAL HIGH (ref 70–99)
Potassium: 3.6 mmol/L (ref 3.5–5.1)
Potassium: 3.4 mmol/L — ABNORMAL LOW (ref 3.5–5.1)
Sodium: 139 mmol/L (ref 135–145)
Sodium: 138 mmol/L (ref 135–145)

## 2021-07-12 LAB — HEMOGLOBIN AND HEMATOCRIT, BLOOD
HCT: 40.1 % (ref 39.0–52.0)
Hemoglobin: 12.8 g/dL — ABNORMAL LOW (ref 13.0–17.0)

## 2021-07-12 LAB — TYPE AND SCREEN
ABO/RH(D): B POS
Antibody Screen: NEGATIVE

## 2021-07-12 LAB — ABO/RH: ABO/RH(D): B POS

## 2021-07-12 SURGERY — NEPHRECTOMY, PARTIAL, ROBOT-ASSISTED
Anesthesia: General | Laterality: Right

## 2021-07-12 MED ORDER — PHENYLEPHRINE HCL (PRESSORS) 10 MG/ML IV SOLN
INTRAVENOUS | Status: AC
  Filled 2021-07-12: qty 1

## 2021-07-12 MED ORDER — FENTANYL CITRATE (PF) 100 MCG/2ML IJ SOLN
INTRAMUSCULAR | Status: DC | PRN
  Administered 2021-07-12 (×2): 50 ug via INTRAVENOUS

## 2021-07-12 MED ORDER — LIDOCAINE HCL (CARDIAC) PF 100 MG/5ML IV SOSY
PREFILLED_SYRINGE | INTRAVENOUS | Status: DC | PRN
  Administered 2021-07-12: 100 mg via INTRAVENOUS

## 2021-07-12 MED ORDER — PROPOFOL 10 MG/ML IV BOLUS
INTRAVENOUS | Status: DC | PRN
  Administered 2021-07-12: 150 mg via INTRAVENOUS

## 2021-07-12 MED ORDER — DEXTROSE-NACL 5-0.45 % IV SOLN: INTRAVENOUS | Status: DC

## 2021-07-12 MED ORDER — FENTANYL CITRATE (PF) 250 MCG/5ML IJ SOLN
INTRAMUSCULAR | Status: AC
  Filled 2021-07-12: qty 5

## 2021-07-12 MED ORDER — KETAMINE HCL 10 MG/ML IJ SOLN
INTRAMUSCULAR | Status: AC
  Filled 2021-07-12: qty 1

## 2021-07-12 MED ORDER — GLYCOPYRROLATE PF 0.2 MG/ML IJ SOSY
PREFILLED_SYRINGE | INTRAMUSCULAR | Status: AC
  Filled 2021-07-12: qty 1

## 2021-07-12 MED ORDER — KETOROLAC TROMETHAMINE 15 MG/ML IJ SOLN
15.0000 mg | Freq: Four times a day (QID) | INTRAMUSCULAR | Status: DC
  Administered 2021-07-12 – 2021-07-13 (×4): 15 mg via INTRAVENOUS
  Filled 2021-07-12 (×4): qty 1

## 2021-07-12 MED ORDER — KETOROLAC TROMETHAMINE 15 MG/ML IJ SOLN
INTRAMUSCULAR | Status: DC | PRN
  Administered 2021-07-12: 15 mg via INTRAVENOUS

## 2021-07-12 MED ORDER — ALPRAZOLAM 0.25 MG PO TABS
0.2500 mg | ORAL_TABLET | Freq: Two times a day (BID) | ORAL | Status: DC | PRN
  Administered 2021-07-12: 0.25 mg via ORAL
  Filled 2021-07-12: qty 1

## 2021-07-12 MED ORDER — AMLODIPINE BESYLATE 5 MG PO TABS
5.0000 mg | ORAL_TABLET | Freq: Every day | ORAL | Status: DC
  Administered 2021-07-13: 5 mg via ORAL
  Filled 2021-07-12: qty 1

## 2021-07-12 MED ORDER — PROMETHAZINE HCL 12.5 MG PO TABS: 12.5000 mg | ORAL_TABLET | ORAL | 0 refills | Status: DC | PRN

## 2021-07-12 MED ORDER — CHLORHEXIDINE GLUCONATE 0.12 % MT SOLN
15.0000 mL | Freq: Once | OROMUCOSAL | Status: AC
  Administered 2021-07-12: 15 mL via OROMUCOSAL

## 2021-07-12 MED ORDER — ORAL CARE MOUTH RINSE: 15.0000 mL | Freq: Once | OROMUCOSAL | Status: AC

## 2021-07-12 MED ORDER — SODIUM CHLORIDE 0.9 % IV SOLN
2.0000 g | Freq: Once | INTRAVENOUS | Status: AC
  Administered 2021-07-12: 2 g via INTRAVENOUS
  Filled 2021-07-12: qty 2

## 2021-07-12 MED ORDER — LACTATED RINGERS IV SOLN: INTRAVENOUS | Status: DC | PRN

## 2021-07-12 MED ORDER — CHLORHEXIDINE GLUCONATE CLOTH 2 % EX PADS
6.0000 | MEDICATED_PAD | Freq: Every day | CUTANEOUS | Status: DC
  Administered 2021-07-13: 6 via TOPICAL

## 2021-07-12 MED ORDER — EPHEDRINE SULFATE 50 MG/ML IJ SOLN
INTRAMUSCULAR | Status: DC | PRN
  Administered 2021-07-12: 5 mg via INTRAVENOUS
  Administered 2021-07-12: 10 mg via INTRAVENOUS

## 2021-07-12 MED ORDER — ONDANSETRON HCL 4 MG/2ML IJ SOLN
INTRAMUSCULAR | Status: AC
  Filled 2021-07-12: qty 2

## 2021-07-12 MED ORDER — PHENYLEPHRINE HCL-NACL 10-0.9 MG/250ML-% IV SOLN
INTRAVENOUS | Status: DC | PRN
  Administered 2021-07-12: 30 ug/min via INTRAVENOUS

## 2021-07-12 MED ORDER — MORPHINE SULFATE (PF) 2 MG/ML IV SOLN: 2.0000 mg | INTRAVENOUS | Status: DC | PRN

## 2021-07-12 MED ORDER — LACTATED RINGERS IV SOLN: INTRAVENOUS | Status: DC

## 2021-07-12 MED ORDER — KETAMINE HCL 10 MG/ML IJ SOLN
INTRAMUSCULAR | Status: DC | PRN
  Administered 2021-07-12: 10 mg via INTRAVENOUS
  Administered 2021-07-12: 30 mg via INTRAVENOUS
  Administered 2021-07-12: 10 mg via INTRAVENOUS

## 2021-07-12 MED ORDER — LIDOCAINE 2% (20 MG/ML) 5 ML SYRINGE
INTRAMUSCULAR | Status: AC
  Filled 2021-07-12: qty 5

## 2021-07-12 MED ORDER — ONDANSETRON HCL 4 MG/2ML IJ SOLN
INTRAMUSCULAR | Status: DC | PRN
  Administered 2021-07-12 (×2): 4 mg via INTRAVENOUS

## 2021-07-12 MED ORDER — MIDAZOLAM HCL 5 MG/5ML IJ SOLN
INTRAMUSCULAR | Status: DC | PRN
  Administered 2021-07-12: 2 mg via INTRAVENOUS

## 2021-07-12 MED ORDER — FENTANYL CITRATE (PF) 100 MCG/2ML IJ SOLN
25.0000 ug | INTRAMUSCULAR | Status: DC | PRN
  Administered 2021-07-12: 50 ug via INTRAVENOUS

## 2021-07-12 MED ORDER — DOCUSATE SODIUM 100 MG PO CAPS
100.0000 mg | ORAL_CAPSULE | Freq: Two times a day (BID) | ORAL | Status: DC
  Administered 2021-07-12 – 2021-07-13 (×2): 100 mg via ORAL
  Filled 2021-07-12 (×2): qty 1

## 2021-07-12 MED ORDER — AMISULPRIDE (ANTIEMETIC) 5 MG/2ML IV SOLN: 10.0000 mg | Freq: Once | INTRAVENOUS | Status: DC | PRN

## 2021-07-12 MED ORDER — PROPOFOL 10 MG/ML IV BOLUS
INTRAVENOUS | Status: AC
  Filled 2021-07-12: qty 40

## 2021-07-12 MED ORDER — "VISTASEAL 4 ML SINGLE DOSE KIT "
4.0000 mL | PACK | Freq: Once | CUTANEOUS | Status: AC
  Administered 2021-07-12: 4 mL via TOPICAL
  Filled 2021-07-12: qty 4

## 2021-07-12 MED ORDER — DOCUSATE SODIUM 100 MG PO CAPS: 100.0000 mg | ORAL_CAPSULE | Freq: Two times a day (BID) | ORAL | Status: DC

## 2021-07-12 MED ORDER — MIDAZOLAM HCL 2 MG/2ML IJ SOLN
INTRAMUSCULAR | Status: AC
  Filled 2021-07-12: qty 2

## 2021-07-12 MED ORDER — ROSUVASTATIN CALCIUM 20 MG PO TABS
20.0000 mg | ORAL_TABLET | Freq: Every morning | ORAL | Status: DC
  Administered 2021-07-13: 20 mg via ORAL
  Filled 2021-07-12: qty 1

## 2021-07-12 MED ORDER — FENTANYL CITRATE (PF) 100 MCG/2ML IJ SOLN
INTRAMUSCULAR | Status: AC
  Filled 2021-07-12: qty 2

## 2021-07-12 MED ORDER — ACETAMINOPHEN 10 MG/ML IV SOLN
1000.0000 mg | Freq: Four times a day (QID) | INTRAVENOUS | Status: AC
  Administered 2021-07-12 – 2021-07-13 (×4): 1000 mg via INTRAVENOUS
  Filled 2021-07-12 (×4): qty 100

## 2021-07-12 MED ORDER — LACTATED RINGERS IR SOLN
Status: DC | PRN
  Administered 2021-07-12: 1

## 2021-07-12 MED ORDER — BUPIVACAINE LIPOSOME 1.3 % IJ SUSP
20.0000 mL | Freq: Once | INTRAMUSCULAR | Status: AC
  Administered 2021-07-12: 20 mL
  Filled 2021-07-12: qty 20

## 2021-07-12 MED ORDER — TRAMADOL HCL 50 MG PO TABS
50.0000 mg | ORAL_TABLET | Freq: Four times a day (QID) | ORAL | Status: DC | PRN
  Administered 2021-07-12: 50 mg via ORAL
  Filled 2021-07-12: qty 1

## 2021-07-12 MED ORDER — SUGAMMADEX SODIUM 500 MG/5ML IV SOLN
INTRAVENOUS | Status: DC | PRN
  Administered 2021-07-12: 300 mg via INTRAVENOUS

## 2021-07-12 MED ORDER — SUGAMMADEX SODIUM 500 MG/5ML IV SOLN
INTRAVENOUS | Status: AC
  Filled 2021-07-12: qty 5

## 2021-07-12 MED ORDER — SPIRONOLACTONE 25 MG PO TABS
25.0000 mg | ORAL_TABLET | Freq: Every day | ORAL | Status: DC
  Administered 2021-07-13: 25 mg via ORAL
  Filled 2021-07-12: qty 1

## 2021-07-12 MED ORDER — STERILE WATER FOR IRRIGATION IR SOLN
Status: DC | PRN
  Administered 2021-07-12: 1000 mL

## 2021-07-12 MED ORDER — CEFAZOLIN SODIUM-DEXTROSE 1-4 GM/50ML-% IV SOLN
1.0000 g | Freq: Three times a day (TID) | INTRAVENOUS | Status: AC
  Administered 2021-07-12 (×2): 1 g via INTRAVENOUS
  Filled 2021-07-12 (×2): qty 50

## 2021-07-12 MED ORDER — ORAL CARE MOUTH RINSE
15.0000 mL | Freq: Two times a day (BID) | OROMUCOSAL | Status: DC
  Administered 2021-07-12 – 2021-07-13 (×3): 15 mL via OROMUCOSAL

## 2021-07-12 MED ORDER — DEXAMETHASONE SODIUM PHOSPHATE 10 MG/ML IJ SOLN
INTRAMUSCULAR | Status: DC | PRN
  Administered 2021-07-12: 10 mg via INTRAVENOUS

## 2021-07-12 MED ORDER — HYDROCODONE-ACETAMINOPHEN 5-325 MG PO TABS: 1.0000 | ORAL_TABLET | Freq: Four times a day (QID) | ORAL | 0 refills | Status: DC | PRN

## 2021-07-12 MED ORDER — ACETAMINOPHEN 500 MG PO TABS
1000.0000 mg | ORAL_TABLET | Freq: Once | ORAL | Status: AC
  Administered 2021-07-12: 1000 mg via ORAL
  Filled 2021-07-12: qty 2

## 2021-07-12 MED ORDER — ROCURONIUM BROMIDE 100 MG/10ML IV SOLN
INTRAVENOUS | Status: DC | PRN
  Administered 2021-07-12 (×4): 50 mg via INTRAVENOUS

## 2021-07-12 MED ORDER — ROCURONIUM BROMIDE 10 MG/ML (PF) SYRINGE
PREFILLED_SYRINGE | INTRAVENOUS | Status: AC
  Filled 2021-07-12: qty 10

## 2021-07-12 MED ORDER — GLYCOPYRROLATE 0.2 MG/ML IJ SOLN
INTRAMUSCULAR | Status: DC | PRN
  Administered 2021-07-12: .2 mg via INTRAVENOUS

## 2021-07-12 MED ORDER — LIDOCAINE HCL (PF) 2 % IJ SOLN
INTRAMUSCULAR | Status: DC | PRN
Start: 2021-07-12 — End: 2021-07-12
  Administered 2021-07-12: 1.5 mg/kg/h via INTRADERMAL

## 2021-07-12 MED ORDER — SODIUM CHLORIDE (PF) 0.9 % IJ SOLN
INTRAMUSCULAR | Status: DC | PRN
  Administered 2021-07-12: 20 mL

## 2021-07-12 MED ORDER — DEXAMETHASONE SODIUM PHOSPHATE 10 MG/ML IJ SOLN
INTRAMUSCULAR | Status: AC
  Filled 2021-07-12: qty 1

## 2021-07-12 MED ORDER — SODIUM CHLORIDE (PF) 0.9 % IJ SOLN
INTRAMUSCULAR | Status: AC
  Filled 2021-07-12: qty 20

## 2021-07-12 MED ORDER — ONDANSETRON HCL 4 MG/2ML IJ SOLN: 4.0000 mg | INTRAMUSCULAR | Status: DC | PRN

## 2021-07-12 MED ORDER — EPHEDRINE 5 MG/ML INJ
INTRAVENOUS | Status: AC
  Filled 2021-07-12: qty 5

## 2021-07-12 MED ORDER — CARVEDILOL 25 MG PO TABS
25.0000 mg | ORAL_TABLET | Freq: Two times a day (BID) | ORAL | Status: DC
  Administered 2021-07-12 – 2021-07-13 (×2): 25 mg via ORAL
  Filled 2021-07-12 (×3): qty 1

## 2021-07-12 MED ORDER — HEMOSTATIC AGENTS (NO CHARGE) OPTIME
TOPICAL | Status: DC | PRN
  Administered 2021-07-12: 1 via TOPICAL

## 2021-07-12 MED ORDER — LIDOCAINE HCL 2 % IJ SOLN
INTRAMUSCULAR | Status: AC
  Filled 2021-07-12: qty 20

## 2021-07-12 SURGICAL SUPPLY — 69 items
APPLICATOR VISTASEAL 35 (MISCELLANEOUS) ×2 IMPLANT
BAG COUNTER SPONGE SURGICOUNT (BAG) IMPLANT
CHLORAPREP W/TINT 26 (MISCELLANEOUS) ×2 IMPLANT
CLIP SUT LAPRA TY ABSORB (SUTURE) ×6 IMPLANT
CLIP VESOLOCK LG 6/CT PURPLE (CLIP) ×4 IMPLANT
CLIP VESOLOCK MED LG 6/CT (CLIP) ×6 IMPLANT
CLIP VESOLOCK XL 6/CT (CLIP) IMPLANT
COVER SURGICAL LIGHT HANDLE (MISCELLANEOUS) ×2 IMPLANT
COVER TIP SHEARS 8 DVNC (MISCELLANEOUS) ×1 IMPLANT
COVER TIP SHEARS 8MM DA VINCI (MISCELLANEOUS) ×1
CUTTER ECHEON FLEX ENDO 45 340 (ENDOMECHANICALS) IMPLANT
DECANTER SPIKE VIAL GLASS SM (MISCELLANEOUS) ×2 IMPLANT
DERMABOND ADVANCED (GAUZE/BANDAGES/DRESSINGS) ×1
DERMABOND ADVANCED .7 DNX12 (GAUZE/BANDAGES/DRESSINGS) ×1 IMPLANT
DRAIN CHANNEL 15F RND FF 3/16 (WOUND CARE) IMPLANT
DRAPE ARM DVNC X/XI (DISPOSABLE) ×4 IMPLANT
DRAPE COLUMN DVNC XI (DISPOSABLE) ×1 IMPLANT
DRAPE DA VINCI XI ARM (DISPOSABLE) ×4
DRAPE DA VINCI XI COLUMN (DISPOSABLE) ×1
DRAPE INCISE IOBAN 66X45 STRL (DRAPES) ×2 IMPLANT
DRAPE SHEET LG 3/4 BI-LAMINATE (DRAPES) ×2 IMPLANT
ELECT PENCIL ROCKER SW 15FT (MISCELLANEOUS) ×2 IMPLANT
ELECT REM PT RETURN 15FT ADLT (MISCELLANEOUS) ×2 IMPLANT
EVACUATOR SILICONE 100CC (DRAIN) IMPLANT
GAUZE 4X4 16PLY ~~LOC~~+RFID DBL (SPONGE) ×2 IMPLANT
GLOVE SRG 8 PF TXTR STRL LF DI (GLOVE) ×2 IMPLANT
GLOVE SURG ENC MOIS LTX SZ6.5 (GLOVE) ×2 IMPLANT
GLOVE SURG ENC TEXT LTX SZ7.5 (GLOVE) ×4 IMPLANT
GLOVE SURG UNDER POLY LF SZ8 (GLOVE) ×2
GOWN STRL REUS W/TWL LRG LVL3 (GOWN DISPOSABLE) ×2 IMPLANT
GOWN STRL REUS W/TWL XL LVL3 (GOWN DISPOSABLE) ×4 IMPLANT
HEMOSTAT SURGICEL 4X8 (HEMOSTASIS) ×2 IMPLANT
HOLDER FOLEY CATH W/STRAP (MISCELLANEOUS) ×2 IMPLANT
IRRIG SUCT STRYKERFLOW 2 WTIP (MISCELLANEOUS) ×2
IRRIGATION SUCT STRKRFLW 2 WTP (MISCELLANEOUS) ×1 IMPLANT
KIT BASIN OR (CUSTOM PROCEDURE TRAY) ×2 IMPLANT
KIT TURNOVER KIT A (KITS) ×2 IMPLANT
LOOP VESSEL MAXI BLUE (MISCELLANEOUS) IMPLANT
MARKER SKIN DUAL TIP RULER LAB (MISCELLANEOUS) ×2 IMPLANT
NEEDLE INSUFFLATION 14GA 120MM (NEEDLE) ×2 IMPLANT
POUCH SPECIMEN RETRIEVAL 10MM (ENDOMECHANICALS) ×2 IMPLANT
PROTECTOR NERVE ULNAR (MISCELLANEOUS) ×4 IMPLANT
SCISSORS LAP 5X35 DISP (ENDOMECHANICALS) ×2 IMPLANT
SEAL CANN UNIV 5-8 DVNC XI (MISCELLANEOUS) ×4 IMPLANT
SEAL XI 5MM-8MM UNIVERSAL (MISCELLANEOUS) ×4
SEALANT SURGICAL APPL DUAL CAN (MISCELLANEOUS) IMPLANT
SET TUBE SMOKE EVAC HIGH FLOW (TUBING) ×2 IMPLANT
SOL ANTI FOG 6CC (MISCELLANEOUS) ×1 IMPLANT
SOLUTION ANTI FOG 6CC (MISCELLANEOUS) ×1
SOLUTION ELECTROLUBE (MISCELLANEOUS) ×2 IMPLANT
STAPLE RELOAD 45 WHT (STAPLE) IMPLANT
STAPLE RELOAD 45MM WHITE (STAPLE)
SUT ETHILON 2 0 PSLX (SUTURE) IMPLANT
SUT MNCRL AB 4-0 PS2 18 (SUTURE) ×4 IMPLANT
SUT PDS AB 0 CT1 36 (SUTURE) IMPLANT
SUT V-LOC BARB 180 2/0GR6 GS22 (SUTURE) ×2
SUT VIC AB 1 CT1 36 (SUTURE) ×8 IMPLANT
SUT VICRYL 0 UR6 27IN ABS (SUTURE) IMPLANT
SUT VLOC BARB 180 ABS3/0GR12 (SUTURE) ×4
SUTURE V-LC BRB 180 2/0GR6GS22 (SUTURE) ×1 IMPLANT
SUTURE VLOC BRB 180 ABS3/0GR12 (SUTURE) ×2 IMPLANT
TOWEL OR 17X26 10 PK STRL BLUE (TOWEL DISPOSABLE) ×2 IMPLANT
TOWEL OR NON WOVEN STRL DISP B (DISPOSABLE) ×2 IMPLANT
TRAY FOLEY MTR SLVR 16FR STAT (SET/KITS/TRAYS/PACK) ×2 IMPLANT
TRAY LAPAROSCOPIC (CUSTOM PROCEDURE TRAY) ×2 IMPLANT
TROCAR BLADELESS OPT 5 100 (ENDOMECHANICALS) IMPLANT
TROCAR UNIVERSAL OPT 12M 100M (ENDOMECHANICALS) IMPLANT
TROCAR XCEL 12X100 BLDLESS (ENDOMECHANICALS) ×2 IMPLANT
WATER STERILE IRR 1000ML POUR (IV SOLUTION) ×2 IMPLANT

## 2021-07-12 NOTE — Progress Notes (Signed)
Pt requests siderails up x4. Louana Fontenot, Laurel Dimmer, RN

## 2021-07-12 NOTE — Anesthesia Procedure Notes (Signed)
Procedure Name: Intubation Date/Time: 07/12/2021 7:35 AM Performed by: Jari Pigg, CRNA Pre-anesthesia Checklist: Patient identified, Emergency Drugs available, Suction available and Patient being monitored Patient Re-evaluated:Patient Re-evaluated prior to induction Oxygen Delivery Method: Circle system utilized Preoxygenation: Pre-oxygenation with 100% oxygen Induction Type: IV induction Ventilation: Mask ventilation without difficulty Laryngoscope Size: Mac and 4 Grade View: Grade I Tube type: Oral Number of attempts: 1 Airway Equipment and Method: Stylet and Oral airway Placement Confirmation: ETT inserted through vocal cords under direct vision, positive ETCO2 and breath sounds checked- equal and bilateral Secured at: 23 cm Tube secured with: Tape Dental Injury: Teeth and Oropharynx as per pre-operative assessment

## 2021-07-12 NOTE — Op Note (Signed)
Operative Note  Preoperative diagnosis:  1.  1.7 cm right renal mass  Postoperative diagnosis: 1.  1.7 cm right renal mass  Procedure(s): 1.  Robot-assisted laparoscopic right partial nephrectomy 2.  Intraoperative ultrasound of single retroperitoneal organ  Surgeon: Ellison Hughs, MD  Assistants: 1.  Debbrah Alar, PA-C  An assistant was required for this surgical procedure.  The duties of the assistant included but were not limited to suctioning, passing suture, camera manipulation, retraction.  This procedure would not be able to be performed without an Environmental consultant.  2. Bishop Limbo, MD PGY-4  Anesthesia:  General  Complications:  None  EBL: 50 mL  Specimens: 1.  Right renal mass  Drains/Catheters: 1.  Foley catheter  Intraoperative findings:   Exophytic right midpole renal mass  Indication:  Alexey Hoole is a 60 y.o. male with a solid and enhancing right renal mass with features concerning for renal cell carcinoma.  The patient has been consented for the above procedures, voices understanding and wishes to proceed.  Description of procedure:  After informed consent was signed, the patient was taken back to the operating room and properly anesthetized.  The patient was then placed in the left lateral decubitus position with all pressure points padded.  The abdomen was then prepped and draped in the usual sterile fashion.  A time-out was then performed.    An 8 mm incision was then made lateral to the right rectus muscle at the level of the right 12th rib.  A Veress needle was then used to access the abdominal cavity.  A saline drop test showed no signs of obstruction and aspiration of the Veress needle revealed no blood or sucus.  The abdominal cavity was then insufflated to 15 mmHg.  An 8 mm robotic trocar was then atraumatically inserted into the abdominal cavity.  The robotic camera was then inserted through the port and inspection of the abdominal cavity revealed  no evidence of adjacent organ or vessel injury.  We then placed three additional 8 mm robotic ports and a 15 mm assistant port in such as fashion as to triangulate the right renal hilum.  The robot was then docked into postion.   Using a combination of blunt and cold scissors dissection, the hepatic attachments were released from the abdominal sidewall.  The white line of Toldt along the ascending colon was then incised, allowing [place] to reflect the colon medially and expose the anterior surface of the right kidney.  The duodenum was then Kocherized medially, which abruptly led [place] to identification of the inferior vena cava.    Once the colon was adequately mobilized, we moved to the lower pole and identified the gonadal vein and ureter.  The gonadal vein was then left running parallel to the vena cava and the right ureter was reflected anteriorly.  Using cautious cautery, the overlying perihilar attachments were then released.  This yielded visualization of the renal hilum, which included a single right renal vein and a single right renal artery.  The perilymphatic tissue surrounding the right renal artery were carefully released so that the right renal artery was fully encircled.    We next turned our attention to defatting the kidney.  An anterior incision along Gerota's fascia was created and the kidney was fully mobilized.   The renal mass is identified at the mid-pole.  Using intraoperative ultrasound, the tumor was then carefully evaluated and demonstrated heterogenous echogenicity compared to the rest of the renal parenchyma. The resection margin was marked  to allow for wide excision of the renal mass.  The renal hilum was once again identified and a bulldog clamp was placed.    Using cold scissors, the right renal mass was then carefully excised, leaving a grossly negative margin.  Excision of the mass appeared complete with no tumor grossly remaining.  The tumor was then placed in the right lower  quadrant, to be retrieved following repair of the renal defect.  A running 3-0 V lock suture was then used to reapproximate the resection bed.  Tension was placed with hemo-lock clips.   The right renal capsule was then reapproximated using a 0 Vicryl suture on a CT-1 needle in an interrupted fashion using hemo-lock clips as a buttress.  Lapra-Ty's were then placed on each of the interrupted sutures.  The bulldog was then released, which warm ischemia time totaled 10 minutes.   Once hemostasis was achieved and the renal bed was irrigated, Surgicel and vistaseal was then placed over the renorrhaphy.  Gerota's fascia was then reapproximated with a running v-lok suture and the mesocolonic fat along the descending colon was then reapproximated to the right abdominal sidewall using Hem-o-lok clips.  The renal mass was retrieved and placed in an Endo Catch bag.  The mass was extracted through the 15 mm assistant port.  The fascia of the assistant port was then reapproximated with a 0 Vicryl suture.    The abdomen was desufflated with all ports removed.  The skin was then reapproximated using running Monocryl and dressed appropriately.  The patient was then replaced in the supine position and was awakened from anesthesia without complications.   Plan:  Monitor on the floor overnight.

## 2021-07-12 NOTE — Anesthesia Postprocedure Evaluation (Signed)
Anesthesia Post Note  Patient: Roberto Martinez  Procedure(s) Performed: XI ROBOTIC ASSITED PARTIAL NEPHRECTOMY (Right)     Patient location during evaluation: PACU Anesthesia Type: General Level of consciousness: awake and alert Pain management: pain level controlled Vital Signs Assessment: post-procedure vital signs reviewed and stable Respiratory status: spontaneous breathing, nonlabored ventilation, respiratory function stable and patient connected to nasal cannula oxygen Cardiovascular status: blood pressure returned to baseline and stable Postop Assessment: no apparent nausea or vomiting Anesthetic complications: no   No notable events documented.  Last Vitals:  Vitals:   07/12/21 1300 07/12/21 1320  BP: 140/70 (!) 152/86  Pulse: (!) 48 (!) 55  Resp: 15 16  Temp: (!) 36.3 C 36.5 C  SpO2: 93% 96%    Last Pain:  Vitals:   07/12/21 1330  TempSrc:   PainSc: 0-No pain                 Tiajuana Amass

## 2021-07-12 NOTE — H&P (Signed)
Urology Preoperative H&P   Chief Complaint: Right renal mass  History of Present Illness: Roberto Martinez is a 60 y.o. male with a 1.7 cm right renal mass with features concerning for RCC. The mass was initially discovered on CT from 04/2021 during an evaluation for abdominal pain and further characterized on MRI. He notes that his abdominal pain has since resolved and he is having regular BMs w/o nausea or emesis. He is urinating w/o difficulty and denies flank pain or hematuria. Recent BMP showed normal renal function. He is a non-smoker and has no prior history of kidney stones or GU malignancies.   -CT chest from 05/2021 was WNL.  Past Medical History:  Diagnosis Date   History of kidney stones    Hypertension     Past Surgical History:  Procedure Laterality Date   APPENDECTOMY  1986    Allergies: No Known Allergies  Family History  Problem Relation Age of Onset   Early death Mother    Hypertension Father    Cancer Sister        breast cancer   Cancer Brother     Social History:  reports that he has never smoked. He has never used smokeless tobacco. He reports that he does not drink alcohol and does not use drugs.  ROS: A complete review of systems was performed.  All systems are negative except for pertinent findings as noted.  Physical Exam:  Vital signs in last 24 hours: Temp:  [98 F (36.7 C)] 98 F (36.7 C) (07/27 0605) Pulse Rate:  [56] 56 (07/27 0605) Resp:  [18] 18 (07/27 0605) BP: (174)/(84) 174/84 (07/27 0605) SpO2:  [98 %] 98 % (07/27 0605) Weight:  [90.4 kg] 90.4 kg (07/27 0552) Constitutional:  Alert and oriented, No acute distress Cardiovascular: Regular rate and rhythm, No JVD Respiratory: Normal respiratory effort, Lungs clear bilaterally GI: Abdomen is soft, nontender, nondistended, no abdominal masses GU: No CVA tenderness Lymphatic: No lymphadenopathy Neurologic: Grossly intact, no focal deficits Psychiatric: Normal mood and affect  Laboratory  Data:  No results for input(s): WBC, HGB, HCT, PLT in the last 72 hours.  Recent Labs    07/12/21 0600  NA 139  K 3.6  CL 100  GLUCOSE 97  BUN 10  CALCIUM 9.8  CREATININE 0.67     Results for orders placed or performed during the hospital encounter of 07/12/21 (from the past 24 hour(s))  ABO/Rh     Status: None   Collection Time: 07/12/21  6:00 AM  Result Value Ref Range   ABO/RH(D)      B POS Performed at [facility], [place] [address] [place], [place] [id number]   Basic metabolic panel     Status: None   Collection Time: 07/12/21  6:00 AM  Result Value Ref Range   Sodium 139 135 - 145 mmol/L   Potassium 3.6 3.5 - 5.1 mmol/L   Chloride 100 98 - 111 mmol/L   CO2 27 22 - 32 mmol/L   Glucose, Bld 97 70 - 99 mg/dL   BUN 10 6 - 20 mg/dL   Creatinine, Ser 0.67 0.61 - 1.24 mg/dL   Calcium 9.8 8.9 - 10.3 mg/dL   GFR, Estimated >60 >60 mL/min   Anion gap 12 5 - 15   Recent Results (from the past 240 hour(s))  SARS CORONAVIRUS 2 (TAT 6-24 HRS) Nasopharyngeal Nasopharyngeal Swab     Status: None   Collection Time: 07/10/21  8:48 AM   Specimen:  Nasopharyngeal Swab  Result Value Ref Range Status   SARS Coronavirus 2 NEGATIVE NEGATIVE Final    Comment: (NOTE) SARS-CoV-2 target nucleic acids are NOT DETECTED.  The SARS-CoV-2 RNA is generally detectable in upper and lower respiratory specimens during the acute phase of infection. Negative results do not preclude SARS-CoV-2 infection, do not rule out co-infections with other pathogens, and should not be used as the sole basis for treatment or other patient management decisions. Negative results must be combined with clinical observations, patient history, and epidemiological information. The expected result is Negative.  Fact Sheet for Patients: SugarRoll.be  Fact Sheet for Healthcare Providers: https://www.woods-mathews.com/  This test is not yet approved or  cleared by the [place] FDA and  has been authorized for detection and/or diagnosis of SARS-CoV-2 by FDA under an Emergency Use Authorization (EUA). This EUA will remain  in effect (meaning this test can be used) for the duration of the COVID-19 declaration under Se ction 564(b)(1) of the Act, 21 U.S.C. section 360bbb-3(b)(1), unless the authorization is terminated or revoked sooner.  Performed at [facility], [place] [address], [place], [place] [address]     Renal Function: Recent Labs    07/12/21 0600  CREATININE 0.67   Estimated Creatinine Clearance: 114.3 mL/min (by C-G formula based on SCr of 0.67 mg/dL).  Radiologic Imaging: CLINICAL DATA:  Right renal mass on CT   EXAM: MRI ABDOMEN WITHOUT AND WITH CONTRAST   TECHNIQUE: Multiplanar multisequence MR imaging of the abdomen was performed both before and after the administration of intravenous contrast.   CONTRAST:  9.82m GADAVIST GADOBUTROL 1 MMOL/ML IV SOLN   COMPARISON:  CT abdomen/pelvis dated 05/07/2021   FINDINGS: Lower chest: Lung bases are clear.   Hepatobiliary: Liver is within normal limits. No suspicious/enhancing hepatic lesions.   Gallbladder is notable for mild layering sludge. No cholelithiasis or associated inflammatory changes. No intrahepatic or extrahepatic ductal dilatation.   Pancreas:  Within normal limits.   Spleen:  Within normal limits.   Adrenals/Urinary Tract:  Adrenal glands are within normal limits.   1.4 x 1.7 x 1.5 cm enhancing lesion in the anterior right upper kidney (series 6/image 17), compatible with solid renal neoplasm such as renal cell carcinoma. Additional subcentimeter cysts bilaterally. No hydronephrosis.   Stomach/Bowel: Stomach is within normal limits.   Visualized bowel is unremarkable.   Vascular/Lymphatic:  No evidence of abdominal aortic aneurysm.   Single right renal artery and vein.  No renal vein invasion.   No suspicious abdominal  lymphadenopathy.   Other:  No abdominal ascites.   Musculoskeletal: No suspicious osseous lesions.   IMPRESSION: 1.7 cm enhancing right upper pole renal lesion, compatible with solid renal neoplasm such as renal cell carcinoma.   Single right renal artery and vein. No renal vein invasion. No regional lymphadenopathy or metastatic disease.     Electronically Signed   By: SJulian HyM.D.   On: 05/15/2021 08:56  I independently reviewed the above imaging studies.  Assessment and Plan Roberto Martinez a 60y.o. male with a solid and enhancing 1.7 cm right upper pole renal mass with features concerning for RCC  -I personally reviewed imaging results and films with the patient. We discussed that the mass in question has features concerning for malignancy. I explained the natural history of presumed renal cell carcinoma. I reviewed the AUA guidelines for evaluation and treatment of the small renal mass. The options of active surveillance, in situ tumor ablation, partial and radical  nephrectomy was discussed. The risks of RIGHT robotic partial nephrectomy were discussed in detail including but not limited to: negative pathology, open conversion, completion nephrectomy, infection of the urinary tract/skin/abdominal cavity, VTE, MI/CVA, lymphatic leak, injury to adjacent solid/hollow viscus organs, bleeding requiring a blood transfusion, catastrophic bleeding, hernia formation, need for postoperative angioembolization, urinary leak requiring stent/drain, and other imponderables. He voices understanding and wishes to proceed.    Roberto Hughs, MD 07/12/2021, 7:27 AM  [facility] Pager: [phone]

## 2021-07-12 NOTE — Discharge Instructions (Signed)

## 2021-07-12 NOTE — Transfer of Care (Signed)
Immediate Anesthesia Transfer of Care Note  Patient: Roberto Martinez  Procedure(s) Performed: XI ROBOTIC ASSITED PARTIAL NEPHRECTOMY (Right)  Patient Location: PACU  Anesthesia Type:General  Level of Consciousness: sedated  Airway & Oxygen Therapy: Patient Spontanous Breathing and Patient connected to face mask oxygen  Post-op Assessment: Report given to RN and Post -op Vital signs reviewed and stable  Post vital signs: Reviewed and stable  Last Vitals:  Vitals Value Taken Time  BP 141/74 07/12/21 1015  Temp    Pulse 49 07/12/21 1017  Resp 23 07/12/21 1017  SpO2 98 % 07/12/21 1017  Vitals shown include unvalidated device data.  Last Pain:  Vitals:   07/12/21 0605  TempSrc: Oral  PainSc:       Patients Stated Pain Goal: 3 ([id number])  Complications: No notable events documented.

## 2021-07-13 ENCOUNTER — Encounter (HOSPITAL_COMMUNITY): Payer: Self-pay | Admitting: Urology

## 2021-07-13 DIAGNOSIS — N2889 Other specified disorders of kidney and ureter: Secondary | ICD-10-CM | POA: Diagnosis not present

## 2021-07-13 DIAGNOSIS — C641 Malignant neoplasm of right kidney, except renal pelvis: Secondary | ICD-10-CM | POA: Diagnosis not present

## 2021-07-13 DIAGNOSIS — I10 Essential (primary) hypertension: Secondary | ICD-10-CM | POA: Diagnosis not present

## 2021-07-13 LAB — BASIC METABOLIC PANEL
Anion gap: 8 (ref 5–15)
BUN: 11 mg/dL (ref 6–20)
CO2: 26 mmol/L (ref 22–32)
Calcium: 8.8 mg/dL — ABNORMAL LOW (ref 8.9–10.3)
Chloride: 102 mmol/L (ref 98–111)
Creatinine, Ser: 0.86 mg/dL (ref 0.61–1.24)
GFR, Estimated: >60 mL/min (ref >60)
Glucose, Bld: 161 mg/dL — ABNORMAL HIGH (ref 70–99)
Potassium: 3.4 mmol/L — ABNORMAL LOW (ref 3.5–5.1)
Sodium: 136 mmol/L (ref 135–145)

## 2021-07-13 LAB — CBC
HCT: 40.4 % (ref 39.0–52.0)
Hemoglobin: 13 g/dL (ref 13.0–17.0)
MCH: 25 pg — ABNORMAL LOW (ref 26.0–34.0)
MCHC: 32.2 g/dL (ref 30.0–36.0)
MCV: 77.5 fL — ABNORMAL LOW (ref 80.0–100.0)
Platelets: 236 10*3/uL (ref 150–400)
RBC: 5.21 MIL/uL (ref 4.22–5.81)
RDW: 15.8 % — ABNORMAL HIGH (ref 11.5–15.5)
WBC: 13.4 10*3/uL — ABNORMAL HIGH (ref 4.0–10.5)
nRBC: 0 % (ref 0.0–0.2)

## 2021-07-13 LAB — SURGICAL PATHOLOGY

## 2021-07-13 MED ORDER — ALUM & MAG HYDROXIDE-SIMETH 200-200-20 MG/5ML PO SUSP
30.0000 mL | Freq: Once | ORAL | Status: AC
  Administered 2021-07-13: 30 mL via ORAL
  Filled 2021-07-13: qty 30

## 2021-07-13 NOTE — Discharge Summary (Signed)
[facility] Discharge Summary  Admit date: 07/12/2021  Discharge date and time: 07/13/21   Discharge to: Home  Discharge Service: Urology  Discharge Attending Physician:  Aleen Campi MD  Discharge  Diagnoses: Right renal mass  Secondary Diagnosis: Active Problems:   Renal mass   OR Procedures: Procedure(s): XI ROBOTIC ASSITED PARTIAL NEPHRECTOMY 07/12/2021   Ancillary Procedures: None   Discharge Day Services: The patient was seen and examined by the Urology team both in the morning and immediately prior to discharge.  Vital signs and laboratory values were stable and within normal limits.  The physical exam was benign and unchanged and all surgical wounds were examined.  Discharge instructions were explained and all questions answered.  Subjective  No acute events overnight. Pain Controlled. No fever or chills.  Objective Patient Vitals for the past 8 hrs:  BP Temp Pulse Resp SpO2  07/13/21 0628 (!) 143/70 (!) 97.5 F (36.4 C) (!) 55 16 96 %  07/13/21 0238 (!) 152/77 (!) 97.4 F (36.3 C) (!) 56 18 98 %   Total I/O In: 240 [P.O.:240] Out: -   General Appearance:        No acute distress Lungs:                       Normal work of breathing on room air Heart:                                Regular rate and rhythm Abdomen:                         Soft, non-tender, non-distended. Incisions C/D/I Extremities:                      Warm and well perfused   Hospital Course:  The patient underwent Robotic Right Partial nephrectomy on 07/12/2021.  The patient tolerated the procedure well, was extubated in the OR, and afterwards was taken to the PACU for routine post-surgical care. When stable the patient was transferred to the floor.   The patient did well postoperatively.  The patient's diet was slowly advanced and at the time of discharge was tolerating a regular diet.  The patient was discharged home 1 Day Post-Op, at which point was tolerating a regular solid diet, was  able to void spontaneously, have adequate pain control with P.O. pain medication, and could ambulate without difficulty. The patient will follow up with [place] for post op check.   Condition at Discharge: Improved  Discharge Medications:  Allergies as of 07/13/2021   No Known Allergies      Medication List     TAKE these medications    ALPRAZolam 0.25 MG tablet Commonly known as: XANAX Take 1 tablet (0.25 mg total) by mouth 2 (two) times daily as needed for anxiety.   amLODipine 10 MG tablet Commonly known as: NORVASC Take 0.5 tablets (5 mg total) by mouth daily.   carvedilol 25 MG tablet Commonly known as: COREG Take 1 tablet (25 mg total) by mouth 2 (two) times daily with a meal.   docusate sodium 100 MG capsule Commonly known as: COLACE Take 1 capsule (100 mg total) by mouth 2 (two) times daily.   HYDROcodone-acetaminophen 5-325 MG tablet Commonly known as: Norco Take 1-2 tablets by mouth every 6 (six) hours as needed for moderate pain.   Potassium 99 MG  Tabs Take 99 mg by mouth 3 (three) times a week.   promethazine 12.5 MG tablet Commonly known as: PHENERGAN Take 1 tablet (12.5 mg total) by mouth every 4 (four) hours as needed for nausea or vomiting.   spironolactone 25 MG tablet Commonly known as: ALDACTONE Take 1 tablet (25 mg total) by mouth daily.       ASK your doctor about these medications    rosuvastatin 20 MG tablet Commonly known as: Crestor Take 1 tablet (20 mg total) by mouth daily.   valsartan-hydrochlorothiazide 320-25 MG tablet Commonly known as: DIOVAN-HCT TAKE 1 TABLET BY MOUTH EVERY DAY

## 2021-07-13 NOTE — Progress Notes (Signed)
Pt has voided 200 cc clear yellow post foley removal. MD updated and Pt clear to be discharged to home today

## 2021-07-13 NOTE — Progress Notes (Signed)
Urology Progress Note   1 Day Post-Op from Right robotic partial Nephrectomy.   Subjective: NAEON. VSS. Pain well controlled. Adequate UOP. Creatinine and hemoglobin stable.   Objective: Vital signs in last 24 hours: Temp:  [95.9 F (35.5 C)-98.3 F (36.8 C)] 97.5 F (36.4 C) (07/28 0628) Pulse Rate:  [42-71] 55 (07/28 0628) Resp:  [15-23] 16 (07/28 0628) BP: (130-171)/(70-100) 143/70 (07/28 0628) SpO2:  [92 %-100 %] 96 % (07/28 0628)  Intake/Output from previous day: 07/27 0701 - 07/28 0700 In: 3720.1 [P.O.:120; I.V.:3329.8; IV Piggyback:270.3] Out: 3950 [Urine:3900; Blood:50] Intake/Output this shift: Total I/O In: 240 [P.O.:240] Out: -   Physical Exam:  General: Alert and oriented CV: Regular rate Lungs: No increased work of breathing Abdomen:  Soft, appropriately tender. Incisions c/d/i. GU: Foley in place draining clear yellow urine  Ext: NT, No erythema  Lab Results: Recent Labs    07/12/21 1027 07/13/21 0516  HGB 12.8* 13.0  HCT 40.1 40.4   Recent Labs    07/12/21 1027 07/13/21 0516  NA 138 136  K 3.4* 3.4*  CL 101 102  CO2 28 26  GLUCOSE 133* 161*  BUN 11 11  CREATININE 0.83 0.86  CALCIUM 9.1 8.8*    Studies/Results: No results found.  Assessment/Plan:  60 y.o. male s/p Right robotic partial nephrectomy.  Overall doing well post-op.   - TOV - stop IV fluids - continue CLD - prn PO analgesics - Ambulate  Dispo: Home today   LOS: 0 days

## 2021-07-13 NOTE — Progress Notes (Signed)
Pt to be discharged to home this afternoon. Pt and Pt's Family given discharge teaching including all discharge Medications and schedules for these Medications. Understanding verbalized of all discharge teaching/instructions Discharge AVS with Pt at time of discharge

## 2021-07-25 ENCOUNTER — Other Ambulatory Visit: Payer: Self-pay | Admitting: Family Medicine

## 2021-07-27 DIAGNOSIS — C641 Malignant neoplasm of right kidney, except renal pelvis: Secondary | ICD-10-CM | POA: Diagnosis not present

## 2021-08-11 ENCOUNTER — Encounter: Payer: Self-pay | Admitting: Family Medicine

## 2021-08-11 ENCOUNTER — Ambulatory Visit (INDEPENDENT_AMBULATORY_CARE_PROVIDER_SITE_OTHER): Payer: BC Managed Care – PPO | Admitting: Family Medicine

## 2021-08-11 ENCOUNTER — Other Ambulatory Visit: Payer: Self-pay

## 2021-08-11 VITALS — BP 142/67 | HR 57 | Temp 98.5°F | Ht 71.0 in | Wt 193.2 lb

## 2021-08-11 DIAGNOSIS — I10 Essential (primary) hypertension: Secondary | ICD-10-CM

## 2021-08-11 MED ORDER — AMLODIPINE BESYLATE-VALSARTAN 5-320 MG PO TABS: 1.0000 | ORAL_TABLET | Freq: Every day | ORAL | 2 refills | Status: DC

## 2021-08-11 MED ORDER — CHLORTHALIDONE 25 MG PO TABS: 25.0000 mg | ORAL_TABLET | Freq: Every day | ORAL | 2 refills | Status: DC

## 2021-08-11 NOTE — Progress Notes (Signed)
Chief Complaint  Patient presents with   recheck blood pressure medication    Subjective Roberto Martinez is a 60 y.o. male who presents for hypertension follow up. He does monitor home blood pressures. Blood pressures ranging from 140's/70's on average. He is compliant with medications- Norvasc 5 mg/d, Coreg 25 m bid, valsartan hct 320-25 mg/d, spironolactone 25 mg/d. Patient has these side effects of medication: none He is adhering to a healthy diet overall. Current exercise: walking No CP or SOB.    Past Medical History:  Diagnosis Date   History of kidney stones    Hypertension     Exam BP (!) 142/67   Pulse (!) 57   Temp 98.5 F (36.9 C) (Oral)   Ht '5\' 11"'$  (1.803 m)   Wt 193 lb 4 oz (87.7 kg)   SpO2 99%   BMI 26.95 kg/m  General:  well developed, well nourished, in no apparent distress Heart: RRR, no bruits, no LE edema Lungs: clear to auscultation, no accessory muscle use Psych: well oriented with normal range of affect and appropriate judgment/insight  Resistant hypertension - Plan: Ambulatory referral to Pulmonology, TSH, Aldosterone + renin activity w/ ratio, Comprehensive metabolic panel  Chronic, uncontrolled. We are getting closer. Change HCT to chlorthalidone. Will combine valsartan and norvasc to Exforge 320-5 mg/d, chlorthalidone 25 mg/d, cont spironolactone 25 mg/d, Coreg 25 mg bid. Monitor BP at home. Will refer to pulm for 2ndary causes. He does snore. Will also ck labs. He just had a mass removed from his kidney. Doubt he requires pheochromocytoma workup w metanephrines. Counseled on diet and exercise. F/u in 1 week for labs, 1 mo for reck BP. The patient voiced understanding and agreement to the plan.  [place], DO 08/11/21  4:26 PM

## 2021-08-11 NOTE — Patient Instructions (Signed)
Keep monitoring your blood pressure at home.  Keep the diet clean and stay active.  If you do not hear anything about your referral in the next 1-2 weeks, call our office and ask for an update.  Give [place] 4-5 business days to get the results of your labs back.   Let us know if you need anything.

## 2021-08-15 LAB — COMPREHENSIVE METABOLIC PANEL
AG Ratio: 1.4 (calc) (ref 1.0–2.5)
ALT: 29 U/L (ref 9–46)
AST: 20 U/L (ref 10–35)
Albumin: 4.2 g/dL (ref 3.6–5.1)
Alkaline phosphatase (APISO): 73 U/L (ref 35–144)
BUN: 15 mg/dL (ref 7–25)
CO2: 27 mmol/L (ref 20–32)
Calcium: 9.7 mg/dL (ref 8.6–10.3)
Chloride: 102 mmol/L (ref 98–110)
Creat: 0.88 mg/dL (ref 0.70–1.30)
Globulin: 2.9 g/dL (calc) (ref 1.9–3.7)
Glucose, Bld: 82 mg/dL (ref 65–99)
Potassium: 4.2 mmol/L (ref 3.5–5.3)
Sodium: 138 mmol/L (ref 135–146)
Total Bilirubin: 0.8 mg/dL (ref 0.2–1.2)
Total Protein: 7.1 g/dL (ref 6.1–8.1)

## 2021-08-15 LAB — TEST AUTHORIZATION

## 2021-08-15 LAB — TSH: TSH: 4.87 mIU/L — ABNORMAL HIGH (ref 0.40–4.50)

## 2021-08-15 LAB — T4, FREE: Free T4: 1.2 ng/dL (ref 0.8–1.8)

## 2021-08-17 LAB — ALDOSTERONE + RENIN ACTIVITY W/ RATIO
ALDO / PRA Ratio: 100 Ratio — ABNORMAL HIGH (ref 0.9–28.9)
Aldosterone: 26 ng/dL
Renin Activity: 0.26 ng/mL/h (ref 0.25–5.82)

## 2021-08-18 ENCOUNTER — Other Ambulatory Visit (INDEPENDENT_AMBULATORY_CARE_PROVIDER_SITE_OTHER): Payer: BC Managed Care – PPO

## 2021-08-18 ENCOUNTER — Other Ambulatory Visit: Payer: Self-pay

## 2021-08-18 DIAGNOSIS — I10 Essential (primary) hypertension: Secondary | ICD-10-CM | POA: Diagnosis not present

## 2021-08-18 LAB — BASIC METABOLIC PANEL
BUN: 12 mg/dL (ref 6–23)
CO2: 28 mEq/L (ref 19–32)
Calcium: 10 mg/dL (ref 8.4–10.5)
Chloride: 100 mEq/L (ref 96–112)
Creatinine, Ser: 1.05 mg/dL (ref 0.40–1.50)
GFR: 77.58 mL/min (ref >60.00)
Glucose, Bld: 91 mg/dL (ref 70–99)
Potassium: 4.2 mEq/L (ref 3.5–5.1)
Sodium: 138 mEq/L (ref 135–145)

## 2021-08-18 NOTE — Addendum Note (Signed)
Addended by: Manuela Schwartz on: 08/18/2021 09:25 AM   Modules accepted: Orders

## 2021-08-28 DIAGNOSIS — C641 Malignant neoplasm of right kidney, except renal pelvis: Secondary | ICD-10-CM | POA: Diagnosis not present

## 2021-10-05 ENCOUNTER — Other Ambulatory Visit: Payer: Self-pay | Admitting: Family Medicine

## 2021-10-05 DIAGNOSIS — I10 Essential (primary) hypertension: Secondary | ICD-10-CM

## 2021-10-07 ENCOUNTER — Other Ambulatory Visit: Payer: Self-pay | Admitting: Family Medicine

## 2021-10-07 DIAGNOSIS — I10 Essential (primary) hypertension: Secondary | ICD-10-CM

## 2021-11-05 ENCOUNTER — Other Ambulatory Visit: Payer: Self-pay | Admitting: Family Medicine

## 2021-11-23 ENCOUNTER — Ambulatory Visit (HOSPITAL_COMMUNITY)
Admission: RE | Admit: 2021-11-23 | Discharge: 2021-11-23 | Disposition: A | Discharge status: Home / Self Care | Payer: BC Managed Care – PPO | Source: Ambulatory Visit | Attending: Urology | Admitting: Urology

## 2021-11-23 ENCOUNTER — Other Ambulatory Visit (HOSPITAL_COMMUNITY): Payer: Self-pay | Admitting: Urology

## 2021-11-23 ENCOUNTER — Other Ambulatory Visit: Payer: Self-pay

## 2021-11-23 DIAGNOSIS — Z85528 Personal history of other malignant neoplasm of kidney: Secondary | ICD-10-CM | POA: Insufficient documentation

## 2021-11-23 DIAGNOSIS — C641 Malignant neoplasm of right kidney, except renal pelvis: Secondary | ICD-10-CM | POA: Diagnosis not present

## 2021-11-27 ENCOUNTER — Encounter: Payer: Self-pay | Admitting: Family Medicine

## 2021-11-27 ENCOUNTER — Ambulatory Visit (INDEPENDENT_AMBULATORY_CARE_PROVIDER_SITE_OTHER): Payer: BC Managed Care – PPO | Admitting: Family Medicine

## 2021-11-27 VITALS — BP 160/82 | HR 55 | Temp 98.3°F | Ht 71.0 in | Wt 195.4 lb

## 2021-11-27 DIAGNOSIS — I10 Essential (primary) hypertension: Secondary | ICD-10-CM

## 2021-11-27 DIAGNOSIS — F515 Nightmare disorder: Secondary | ICD-10-CM | POA: Insufficient documentation

## 2021-11-27 NOTE — Patient Instructions (Signed)
OK to keep the medicines the same right now.   Keep the diet clean and stay active.  Check your blood pressures 2-3 times per week, alternating the time of day you check it. If it is high, considering waiting 1-2 minutes and rechecking. If it gets higher, your anxiety is likely creeping up and we should avoid rechecking.   Sleep is important to [place] all. Getting good sleep is imperative to adequate functioning during the day. Work with our counselors who are trained to help people obtain quality sleep. Call [phone] to schedule an appointment or if you are curious about insurance coverage/cost.  Sleep Hygiene Tips: Do not watch TV or look at screens within 1 hour of going to bed. If you do, make sure there is a blue light filter (nighttime mode) involved. Try to go to bed around the same time every night. Wake up at the same time within 1 hour of regular time. Ex: If you wake up at 7 AM for work, do not sleep past 8 AM on days that you don't work. Do not drink alcohol before bedtime. Do not consume caffeine-containing beverages after noon or within 9 hours of intended bedtime. Get regular exercise/physical activity in your life, but not within 2 hours of planned bedtime. Do not take naps.  Do not eat within 2 hours of planned bedtime. Melatonin, 3-5 mg 30-60 minutes before planned bedtime may be helpful.  The bed should be for sleep or sex only. If after 20-30 minutes you are unable to fall asleep, get up and do something relaxing. Do this until you feel ready to go to sleep again.   Let us know if you need anything.

## 2021-11-27 NOTE — Progress Notes (Signed)
Chief Complaint  Patient presents with   Blood Pressure Check    Stopped exforge Waking 3 to 4 times per night.     Subjective Roberto Martinez is a 60 y.o. male who presents for hypertension follow up. He does monitor home blood pressures. Blood pressures ranging from 120-130's/70's on average. He is compliant with medications- Coreg 25 mg bid, spironolactone 25 mg/d, chlorthalidone 25 mg/d.  He did stop taking the Exforge when his blood pressures dropped into the 90s and low 569 systolic.  He felt lightheaded when this happened.  He has not felt that since stopping the medication. Patient has these side effects of medication: none He is usually adhering to a healthy diet overall. Current exercise: walking No Cp or SOB.   For nearly a year, the patient has been experiencing intermittent nightmares.  He wakes up screaming and feeling that some spirit is invading his house and trying to kill him.  This happens regardless where he sleeps.  He does not watch scary movies/shows.  He does not believe in real life that anybody is trying to kill or harm him.  He does not have insomnia.  He has never tried anything for this so far.   Past Medical History:  Diagnosis Date   History of kidney stones    Hypertension     Exam BP (!) 160/82   Pulse (!) 55   Temp 98.3 F (36.8 C) (Oral)   Ht 5\' 11"  (1.803 m)   Wt 195 lb 6 oz (88.6 kg)   SpO2 99%   BMI 27.25 kg/m  General:  well developed, well nourished, in no apparent distress Heart: Reg rhythm, bradycardic no bruits, no LE edema Lungs: clear to auscultation, no accessory muscle use Psych: well oriented with normal range of affect and appropriate judgment/insight  Essential hypertension  Nightmares  Chronic, unsure if controlled.  Continue spironolactone 25 mg daily, chlorthalidone 25 mg daily, carvedilol 25 mg twice daily.  Monitor blood pressure at home, come back in 1 week for nurse visit.  Bring blood pressure monitor and readings.   If not controlled, will add back amlodipine 5 mg daily and follow-up in 2 weeks with me in the office.  Counseled on diet and exercise. Chronic, uncontrolled.  Counseling resources provided.  Offered medication but declined.  Would consider prazosin 1 mg nightly to start. The patient voiced understanding and agreement to the plan.  [place], DO 11/27/21  4:21 PM

## 2021-11-28 DIAGNOSIS — C641 Malignant neoplasm of right kidney, except renal pelvis: Secondary | ICD-10-CM | POA: Diagnosis not present

## 2021-11-28 DIAGNOSIS — Z9889 Other specified postprocedural states: Secondary | ICD-10-CM | POA: Diagnosis not present

## 2021-11-28 DIAGNOSIS — Z87442 Personal history of urinary calculi: Secondary | ICD-10-CM | POA: Diagnosis not present

## 2021-11-28 DIAGNOSIS — Z85528 Personal history of other malignant neoplasm of kidney: Secondary | ICD-10-CM | POA: Diagnosis not present

## 2021-11-28 DIAGNOSIS — Z905 Acquired absence of kidney: Secondary | ICD-10-CM | POA: Diagnosis not present

## 2021-11-30 DIAGNOSIS — C641 Malignant neoplasm of right kidney, except renal pelvis: Secondary | ICD-10-CM | POA: Diagnosis not present

## 2021-11-30 DIAGNOSIS — N401 Enlarged prostate with lower urinary tract symptoms: Secondary | ICD-10-CM | POA: Diagnosis not present

## 2021-11-30 DIAGNOSIS — N528 Other male erectile dysfunction: Secondary | ICD-10-CM | POA: Diagnosis not present

## 2021-11-30 DIAGNOSIS — R351 Nocturia: Secondary | ICD-10-CM | POA: Diagnosis not present

## 2021-12-05 ENCOUNTER — Ambulatory Visit (INDEPENDENT_AMBULATORY_CARE_PROVIDER_SITE_OTHER): Payer: BC Managed Care – PPO

## 2021-12-05 DIAGNOSIS — I10 Essential (primary) hypertension: Secondary | ICD-10-CM | POA: Diagnosis not present

## 2021-12-05 NOTE — Progress Notes (Addendum)
Pt here for Blood pressure check per Dr Nani Ravens  Pt currently takes: Carvedilol 25 mg Spironolactone 25 mg/d, chlorthalidone 25 mg/d.  He did stop taking the Exforge when his blood pressures dropped into the 90s and low 818 systolic  Pt reports compliance with medication.  BP today @ = 132/ 80- L arm: manual cuff. BP was 143/80, then 133/78 L wrist with automatic cuff HR = 63  Pt advised per Dr Nani Ravens to continue current medications and that wrist cuff was pretty accurate.

## 2022-01-01 ENCOUNTER — Other Ambulatory Visit: Payer: Self-pay | Admitting: Family Medicine

## 2022-01-01 DIAGNOSIS — I10 Essential (primary) hypertension: Secondary | ICD-10-CM

## 2022-03-05 IMAGING — CT CT CHEST W/ CM
2 of 4 series · 15 of 36 positions shown, 18 images · IV contrast (omnipaque)
Comparison: None.

CLINICAL DATA: Pulmonary nodule, abnormal chest radiograph

EXAM:
CT CHEST WITH CONTRAST
TECHNIQUE: Multidetector CT imaging of the chest was performed during
intravenous contrast administration.
CONTRAST:  75mL OMNIPAQUE IOHEXOL 300 MG/ML  SOLN

[Series 2: axial st · axial · 0.72mm/px · z∈[-320,-74]mm · 12 of 147 slices shown, 15 images]
[im 12/147  mediastinal]
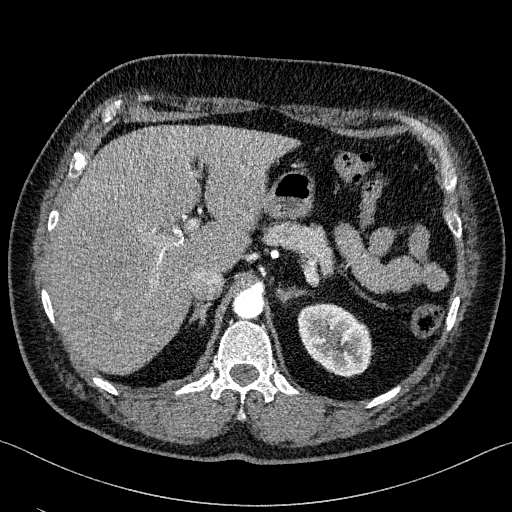
[im 12/147  lung]
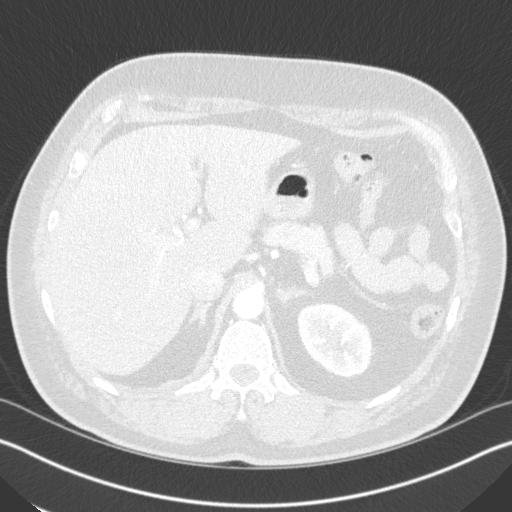
[im 23/147  lung]
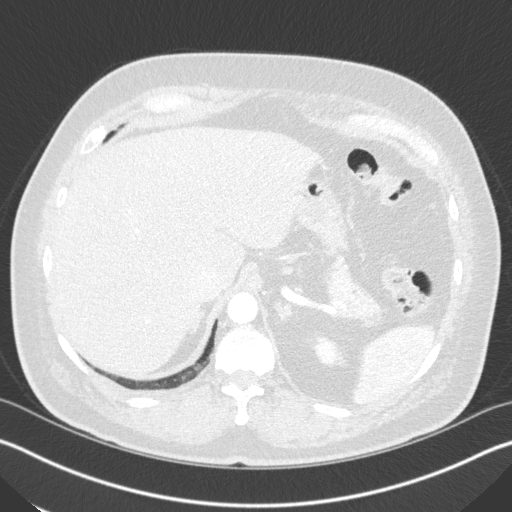
[im 34/147  lung]
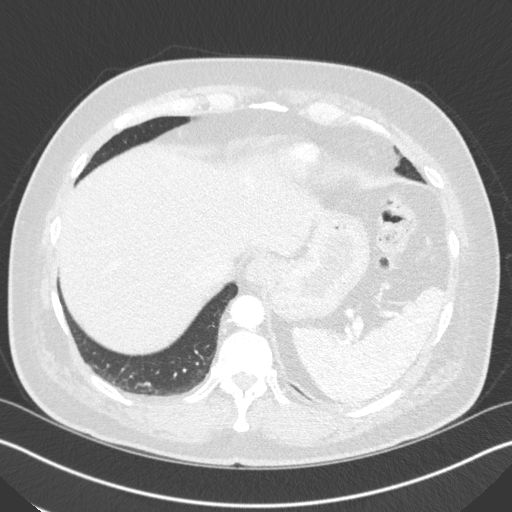
[im 45/147  lung]
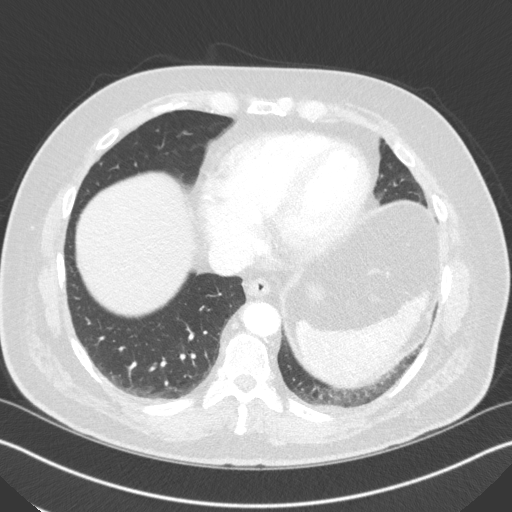
[im 57/147  mediastinal]
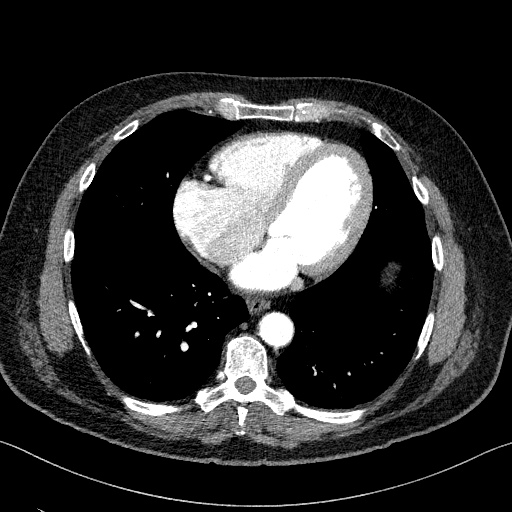
[im 57/147  lung]
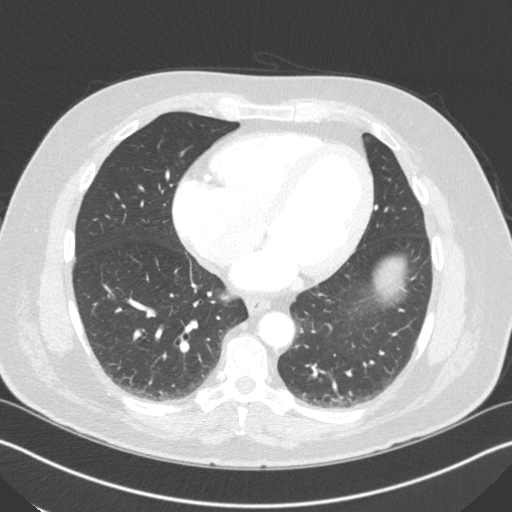
[im 68/147  lung]
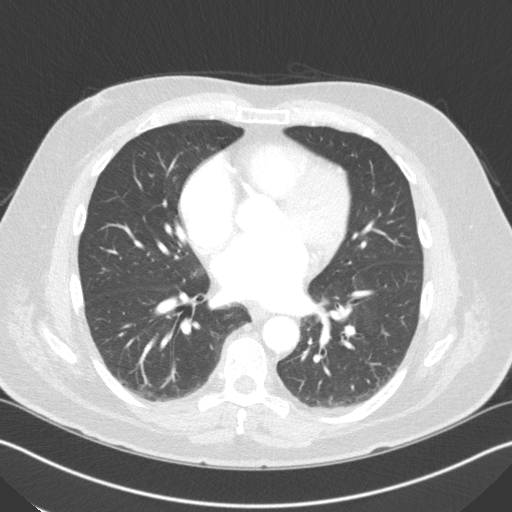
[im 79/147  lung]
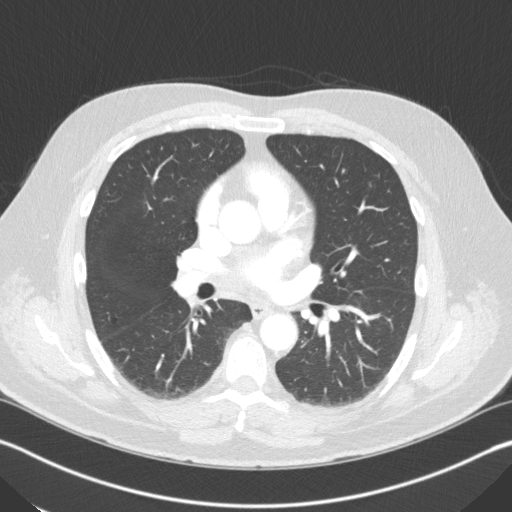
[im 90/147  lung]
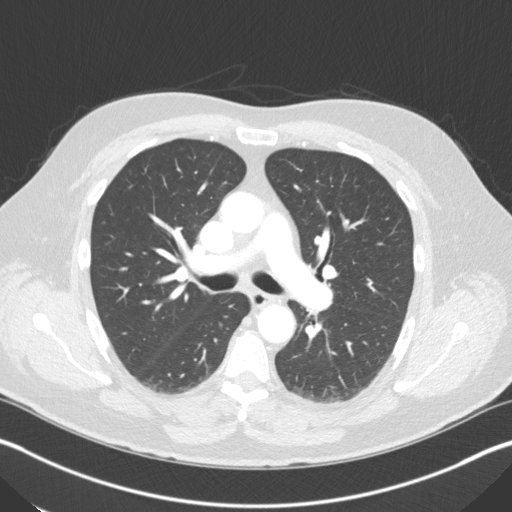
[im 102/147  mediastinal]
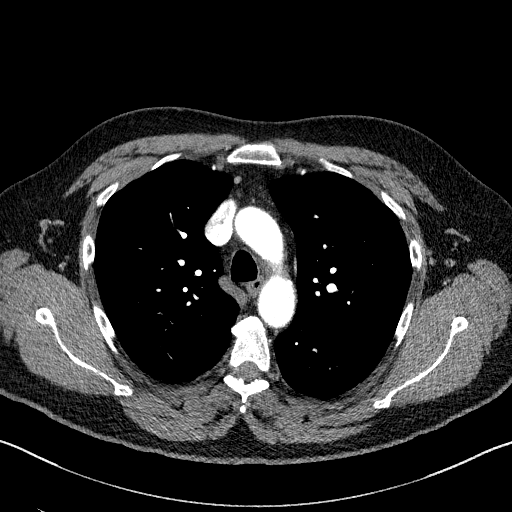
[im 102/147  lung]
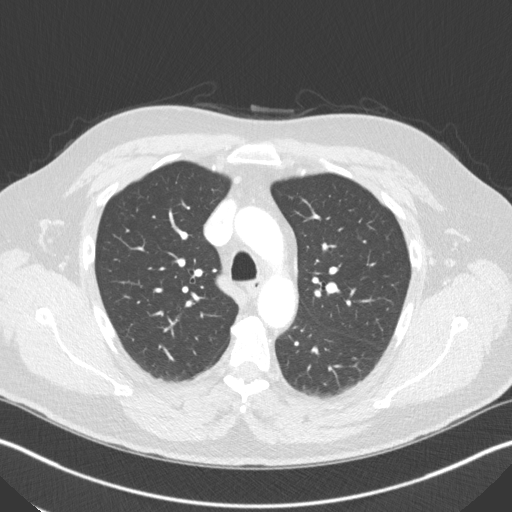
[im 113/147  lung]
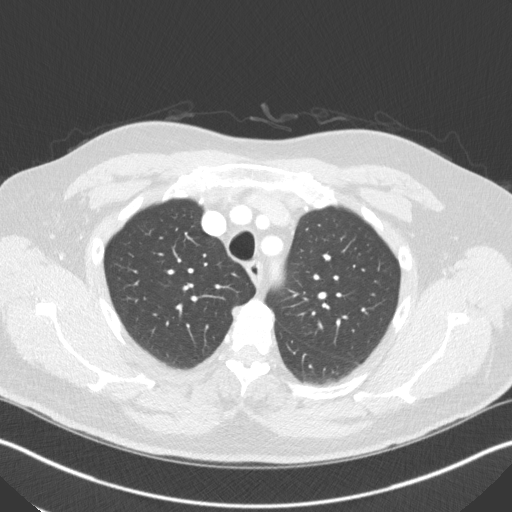
[im 124/147  lung]
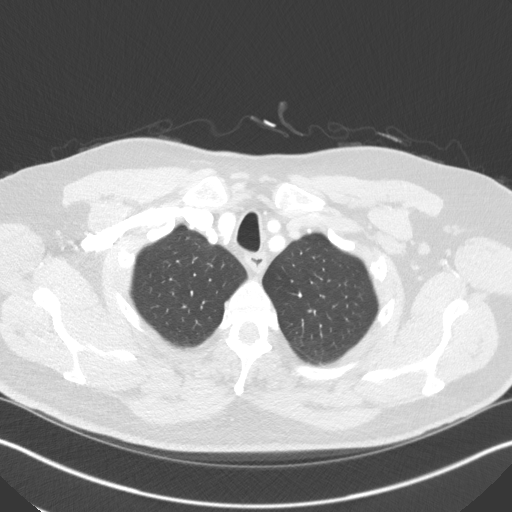
[im 135/147  lung]
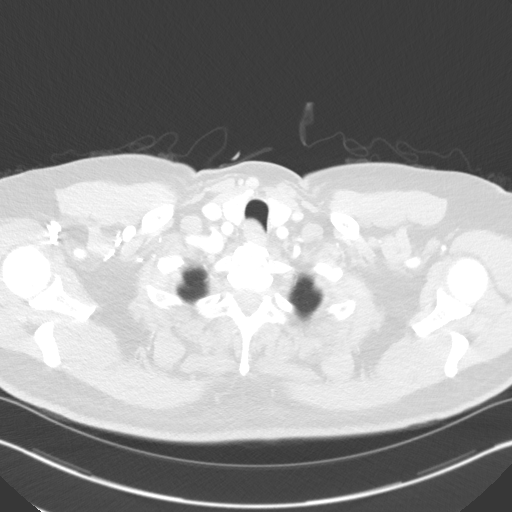

[Series 5: coronal · coronal · 0.58mm/px · 3 of 129 slices shown]
[im 26/129  lung]
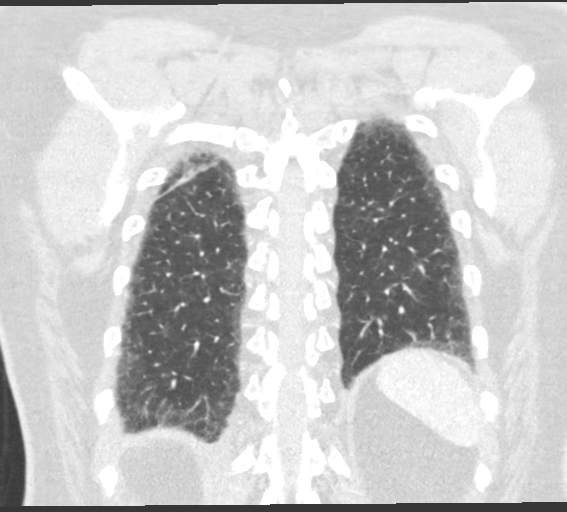
[im 52/129  lung]
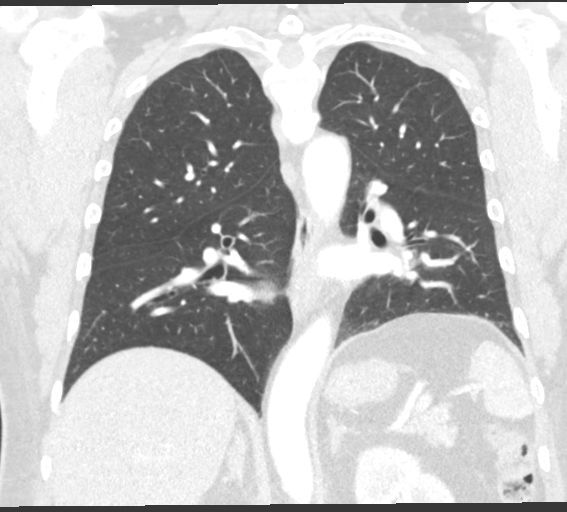
[im 77/129  lung]
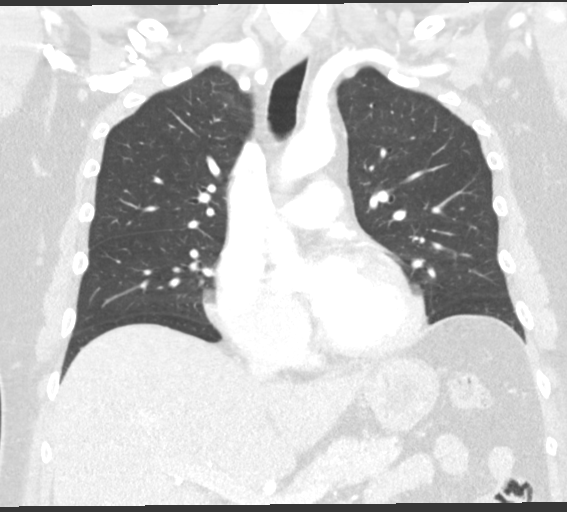

[15 of 36 positions shown; findings below may reference images not displayed]

FINDINGS: Cardiovascular: Mild coronary artery calcification. Global cardiac
size within normal limits. No pericardial effusion. The central
pulmonary arteries are of normal caliber. Mild atherosclerotic
calcification within the thoracic aorta. No aortic aneurysm.

Mediastinum/Nodes: No pathologic thoracic adenopathy. Esophagus
unremarkable.

Lungs/Pleura: The lungs are clear. The opacity noted on prior chest
radiograph is artifactual and may relate to the a right fourth
costotransverse articulation. No pneumothorax or pleural effusion.
Central airways are widely patent.

Upper Abdomen: No acute abnormality.

Musculoskeletal: The osseous structures are age-appropriate. No
acute bone abnormality.
IMPRESSION: No focal pulmonary nodule identified.  The lungs are clear.

Mild coronary artery calcification.

Aortic Atherosclerosis ([id number]).

## 2022-04-03 ENCOUNTER — Other Ambulatory Visit: Payer: Self-pay | Admitting: Family Medicine

## 2022-04-03 DIAGNOSIS — I10 Essential (primary) hypertension: Secondary | ICD-10-CM

## 2022-05-06 ENCOUNTER — Other Ambulatory Visit: Payer: Self-pay | Admitting: Family Medicine

## 2022-05-16 ENCOUNTER — Encounter: Payer: Self-pay | Admitting: Family Medicine

## 2022-05-16 ENCOUNTER — Other Ambulatory Visit (INDEPENDENT_AMBULATORY_CARE_PROVIDER_SITE_OTHER): Payer: BC Managed Care – PPO

## 2022-05-16 ENCOUNTER — Other Ambulatory Visit: Payer: Self-pay | Admitting: Family Medicine

## 2022-05-16 ENCOUNTER — Ambulatory Visit (INDEPENDENT_AMBULATORY_CARE_PROVIDER_SITE_OTHER): Payer: BC Managed Care – PPO | Admitting: Family Medicine

## 2022-05-16 VITALS — BP 120/72 | HR 54 | Temp 98.2°F | Ht 71.0 in | Wt 193.1 lb

## 2022-05-16 DIAGNOSIS — M2550 Pain in unspecified joint: Secondary | ICD-10-CM

## 2022-05-16 DIAGNOSIS — Z0000 Encounter for general adult medical examination without abnormal findings: Secondary | ICD-10-CM

## 2022-05-16 DIAGNOSIS — R202 Paresthesia of skin: Secondary | ICD-10-CM | POA: Diagnosis not present

## 2022-05-16 DIAGNOSIS — Z125 Encounter for screening for malignant neoplasm of prostate: Secondary | ICD-10-CM

## 2022-05-16 DIAGNOSIS — E538 Deficiency of other specified B group vitamins: Secondary | ICD-10-CM

## 2022-05-16 DIAGNOSIS — N6342 Unspecified lump in left breast, subareolar: Secondary | ICD-10-CM

## 2022-05-16 LAB — COMPREHENSIVE METABOLIC PANEL
ALT: 19 U/L (ref 0–53)
AST: 18 U/L (ref 0–37)
Albumin: 4.5 g/dL (ref 3.5–5.2)
Alkaline Phosphatase: 49 U/L (ref 39–117)
BUN: 15 mg/dL (ref 6–23)
CO2: 27 mEq/L (ref 19–32)
Calcium: 9.8 mg/dL (ref 8.4–10.5)
Chloride: 101 mEq/L (ref 96–112)
Creatinine, Ser: 1.16 mg/dL (ref 0.40–1.50)
GFR: 68.48 mL/min (ref >60.00)
Glucose, Bld: 90 mg/dL (ref 70–99)
Potassium: 4.5 mEq/L (ref 3.5–5.1)
Sodium: 137 mEq/L (ref 135–145)
Total Bilirubin: 1.3 mg/dL — ABNORMAL HIGH (ref 0.2–1.2)
Total Protein: 7.2 g/dL (ref 6.0–8.3)

## 2022-05-16 LAB — LIPID PANEL
Cholesterol: 114 mg/dL (ref 0–200)
HDL: 43.2 mg/dL (ref >39.00)
LDL Cholesterol: 44 mg/dL (ref 0–99)
NonHDL: 70.85
Total CHOL/HDL Ratio: 3
Triglycerides: 133 mg/dL (ref 0.0–149.0)
VLDL: 26.6 mg/dL (ref 0.0–40.0)

## 2022-05-16 LAB — PSA: PSA: 0.69 ng/mL (ref 0.10–4.00)

## 2022-05-16 LAB — CBC
HCT: 38.7 % — ABNORMAL LOW (ref 39.0–52.0)
Hemoglobin: 12.8 g/dL — ABNORMAL LOW (ref 13.0–17.0)
MCHC: 33.2 g/dL (ref 30.0–36.0)
MCV: 81.5 fl (ref 78.0–100.0)
Platelets: 201 10*3/uL (ref 150.0–400.0)
RBC: 4.75 Mil/uL (ref 4.22–5.81)
RDW: 13.5 % (ref 11.5–15.5)
WBC: 7.1 10*3/uL (ref 4.0–10.5)

## 2022-05-16 LAB — VITAMIN B12: Vitamin B-12: 181 pg/mL — ABNORMAL LOW (ref 211–911)

## 2022-05-16 LAB — MAGNESIUM: Magnesium: 1.8 mg/dL (ref 1.5–2.5)

## 2022-05-16 LAB — URIC ACID: Uric Acid, Serum: 6.5 mg/dL (ref 4.0–7.8)

## 2022-05-16 LAB — TSH: TSH: 3.4 u[IU]/mL (ref 0.35–5.50)

## 2022-05-16 NOTE — Patient Instructions (Addendum)
Give [place] 2-3 business days to get the results of your labs back.   Keep the diet clean and stay active.  Please get me a copy of your advanced directive form at your convenience.   Consider buddy taping the middle finger.   Ice/cold pack over area for 10-15 min twice daily.  OK to take Tylenol 1000 mg (2 extra strength tabs) or 975 mg (3 regular strength tabs) every 6 hours as needed.  Consider topical capsaicin cream for your feet. This is over the counter.   Consider metatarsal pads for the R foot pain issue.   Someone will reach out in the next week or so regarding the imaging of your chest.   Let us know if you need anything.  Hand Exercises Hand exercises can be helpful for almost anyone. These exercises can strengthen the hands, improve flexibility and movement, and increase blood flow to the hands. These results can make work and daily tasks easier. Hand exercises can be especially helpful for people who have joint pain from arthritis or have nerve damage from overuse (carpal tunnel syndrome). These exercises can also help people who have injured a hand. Exercises Most of these hand exercises are gentle stretching and motion exercises. It is usually safe to do them often throughout the day. Warming up your hands before exercise may help to reduce stiffness. You can do this with gentle massage or by placing your hands in warm water for 10-15 minutes. It is normal to feel some stretching, pulling, tightness, or mild discomfort as you begin new exercises. This will gradually improve. Stop an exercise right away if you feel sudden, severe pain or your pain gets worse. Ask your health care provider which exercises are best for you. Knuckle bend or "claw" fist Stand or sit with your arm, hand, and all five fingers pointed straight up. Make sure to keep your wrist straight during the exercise. Gently bend your fingers down toward your palm until the tips of your fingers are touching the top  of your palm. Keep your big knuckle straight and just bend the small knuckles in your fingers. Hold this position for 3 seconds. Straighten (extend) your fingers back to the starting position. Repeat this exercise 5-10 times with each hand. Full finger fist Stand or sit with your arm, hand, and all five fingers pointed straight up. Make sure to keep your wrist straight during the exercise. Gently bend your fingers into your palm until the tips of your fingers are touching the middle of your palm. Hold this position for 3 seconds. Extend your fingers back to the starting position, stretching every joint fully. Repeat this exercise 5-10 times with each hand. Straight fist Stand or sit with your arm, hand, and all five fingers pointed straight up. Make sure to keep your wrist straight during the exercise. Gently bend your fingers at the big knuckle, where your fingers meet your hand, and the middle knuckle. Keep the knuckle at the tips of your fingers straight and try to touch the bottom of your palm. Hold this position for 3 seconds. Extend your fingers back to the starting position, stretching every joint fully. Repeat this exercise 5-10 times with each hand. Tabletop Stand or sit with your arm, hand, and all five fingers pointed straight up. Make sure to keep your wrist straight during the exercise. Gently bend your fingers at the big knuckle, where your fingers meet your hand, as far down as you can while keeping the small knuckles  in your fingers straight. Think of forming a tabletop with your fingers. Hold this position for 3 seconds. Extend your fingers back to the starting position, stretching every joint fully. Repeat this exercise 5-10 times with each hand. Finger spread Place your hand flat on a table with your palm facing down. Make sure your wrist stays straight as you do this exercise. Spread your fingers and thumb apart from each other as far as you can until you feel a gentle  stretch. Hold this position for 3 seconds. Bring your fingers and thumb tight together again. Hold this position for 3 seconds. Repeat this exercise 5-10 times with each hand. Making circles Stand or sit with your arm, hand, and all five fingers pointed straight up. Make sure to keep your wrist straight during the exercise. Make a circle by touching the tip of your thumb to the tip of your index finger. Hold for 3 seconds. Then open your hand wide. Repeat this motion with your thumb and each finger on your hand. Repeat this exercise 5-10 times with each hand. Thumb motion Sit with your forearm resting on a table and your wrist straight. Your thumb should be facing up toward the ceiling. Keep your fingers relaxed as you move your thumb. Lift your thumb up as high as you can toward the ceiling. Hold for 3 seconds. Bend your thumb across your palm as far as you can, reaching the tip of your thumb for the small finger (pinkie) side of your palm. Hold for 3 seconds. Repeat this exercise 5-10 times with each hand. Grip strengthening  Hold a stress ball or other soft ball in the middle of your hand. Slowly increase the pressure, squeezing the ball as much as you can without causing pain. Think of bringing the tips of your fingers into the middle of your palm. All of your finger joints should bend when doing this exercise. Hold your squeeze for 3 seconds, then relax. Repeat this exercise 5-10 times with each hand. Contact a health care provider if: Your hand pain or discomfort gets much worse when you do an exercise. Your hand pain or discomfort does not improve within 2 hours after you exercise. If you have any of these problems, stop doing these exercises right away. Do not do them again unless your health care provider says that you can. Get help right away if: You develop sudden, severe hand pain or swelling. If this happens, stop doing these exercises right away. Do not do them again unless  your health care provider says that you can. Make sure you discuss any questions you have with your health care provider. Document Revised: 03/26/2019 Document Reviewed: 12/04/2018 Elsevier Patient Education  [place].

## 2022-05-16 NOTE — Progress Notes (Signed)
Chief Complaint  Patient presents with   Annual Exam   Arthritis    Right hand and right leg Burning sensation in both legs at night    Well Male Roberto Martinez is here for a complete physical.   His last physical was >1 year ago.  Current diet: in general, a "healthy" diet.  Current exercise: walking Weight trend: stable Fatigue out of ordinary? No. Seat belt? Yes.   Advanced directive? No  Health maintenance Shingrix- Yes Colonoscopy- Yes Tetanus- Yes HIV- Declined Hep C- Declined  Breast lump- over a mo, area on L nipple area that is bigger. Pain when pushing. No itching or drainage. Has not tried anything at home. No redness.   Right middle finger- Bowling 5 mo ago and hurt PIP of 3rd digit. Still swollen w decreased ROM. Not improving. Losing grip strength 2/2 pain. Associated swelling without bruising or redness. No other hand joints involved. No neuro s/s's.   R foot pain- Started a few mo ago. Hurts when he walks. He has to wear supportive shoes or it will be very difficult to walk. No bruising, redness, swelling. No inj or change in activity.   Numbness/tingling when he goes to bed nightly. Started 3 mo ago. No weakness or balance issues. Affects mainly the bottom of the feet. Massage is helpful. No skin changes or inciting event.    Past Medical History:  Diagnosis Date   History of kidney stones    Hypertension       Past Surgical History:  Procedure Laterality Date   APPENDECTOMY  1986   ROBOTIC ASSITED PARTIAL NEPHRECTOMY Right 07/12/2021   Procedure: XI ROBOTIC ASSITED PARTIAL NEPHRECTOMY;  Surgeon: Ceasar Mons, MD;  Location: [facility];  Service: Urology;  Laterality: Right;    Medications  Current Outpatient Medications on File Prior to Visit  Medication Sig Dispense Refill   carvedilol (COREG) 25 MG tablet TAKE 1 TABLET (25 MG TOTAL) BY MOUTH 2 (TWO) TIMES DAILY WITH A MEAL. 180 tablet 2   chlorthalidone (HYGROTON) 25 MG tablet TAKE 1  TABLET (25 MG TOTAL) BY MOUTH DAILY. 90 tablet 1   Potassium 99 MG TABS Take 99 mg by mouth 3 (three) times a week.     rosuvastatin (CRESTOR) 20 MG tablet Take 1 tablet (20 mg total) by mouth daily. (Patient taking differently: Take 20 mg by mouth in the morning.) 90 tablet 3   spironolactone (ALDACTONE) 25 MG tablet TAKE 1 TABLET (25 MG TOTAL) BY MOUTH DAILY. 90 tablet 0    Allergies No Known Allergies  Family History Family History  Problem Relation Age of Onset   Early death Mother    Hypertension Father    Cancer Sister        breast cancer   Cancer Brother     Review of Systems: Constitutional:  no fevers Eye:  no recent significant change in vision Ear/Nose/Mouth/Throat:  Ears:  no hearing loss Nose/Mouth/Throat:  no complaints of nasal congestion, no sore throat Cardiovascular:  no chest pain Respiratory:  no shortness of breath Gastrointestinal:  no change in bowel habits GU:  Male: negative for dysuria, frequency Musculoskeletal/Extremities:  as noted in HPI Integumentary (Skin/Breast):  +L breast lump Neurologic:  no headaches Endocrine: No unexpected weight changes Hematologic/Lymphatic:  no abnormal bleeding  Exam BP 120/72   Pulse (!) 54   Temp 98.2 F (36.8 C) (Oral)   Ht '5\' 11"'$  (1.803 m)   Wt 193 lb 2 oz (87.6 kg)  SpO2 99%   BMI 26.94 kg/m  General:  well developed, well nourished, in no apparent distress Skin: Indurated and circular area just above the areola on the left breast that is tender to palpation.  There is no fluctuance, erythema, excessive warmth, drainage, cracking of the nipple, or nipple drainage.  I do not appreciate any axillary lymphadenopathy either. Head:  no masses, lesions, or tenderness Eyes:  pupils equal and round, sclera anicteric without injection Ears:  canals without lesions, TMs shiny without retraction, no obvious effusion, no erythema Nose:  nares patent, septum midline, mucosa normal Throat/Pharynx:  lips and gingiva  without lesion; tongue and uvula midline; non-inflamed pharynx; no exudates or postnasal drainage Neck: neck supple without adenopathy, thyromegaly, or masses Cardiac: RRR, no bruits, no LE edema Lungs:  clear to auscultation, breath sounds equal bilaterally, no respiratory distress Abdomen: BS+, soft, non-tender, non-distended, no masses or organomegaly noted Rectal: Deferred Musculoskeletal: Right third digit, PIP joint is tender to palpation with soft tissue swelling appreciated, no erythema, excessive warmth, fluctuance, or drainage Neuro:  gait normal; deep tendon reflexes normal and symmetric, grip strength slightly decreased on the right compared to the left Psych: well oriented with normal range of affect and appropriate judgment/insight  Assessment and Plan  Well adult exam - Plan: CBC, Comprehensive metabolic panel, Lipid panel  Arthralgia, unspecified joint - Plan: Uric acid, Antinuclear Antib (ANA), Cyclic citrul peptide antibody, IgG (QUEST), Rheumatoid Factor, Anti-DNA antibody, double-stranded, Anti-Smith antibody, Sjogrens syndrome-A extractable nuclear antibody, Sjogrens syndrome-B extractable nuclear antibody  Paresthesias - Plan: TSH, B12, Magnesium  Subareolar mass of left breast - Plan: MM Digital Diagnostic Unilat L, US BREAST COMPLETE UNI LEFT INC AXILLA   Well 61 y.o. male. Counseled on diet and exercise. Counseled on risks and benefits of prostate cancer screening with PSA. The patient agrees to undergo testing. Advanced directive form provided today.  Breast mass: New problem with uncertain prognosis.  Check diagnostic mammogram in addition to ultrasound of the area.  Hopefully just gynecomastia secondary to the spironolactone. Paresthesias: Check above labs.  It is intermittent and bilateral which makes me less likely think it is related to his sugars or radiating from his back. Joint pains: New problem, uncertain prognosis.  Metatarsal pads recommended for the  right foot, will check for autoimmune issues as above.  Could consider sports medicine referral for possible injection. Immunizations, labs, and further orders as above. Follow up in 6 months pending the above. The patient voiced understanding and agreement to the plan.  [place], DO 05/16/22 12:04 PM

## 2022-05-17 LAB — CYCLIC CITRUL PEPTIDE ANTIBODY, IGG: Cyclic Citrullin Peptide Ab: <16 UNITS

## 2022-05-17 LAB — RHEUMATOID FACTOR: Rheumatoid fact SerPl-aCnc: <14 IU/mL (ref <14)

## 2022-05-17 LAB — SJOGRENS SYNDROME-A EXTRACTABLE NUCLEAR ANTIBODY: SSA (Ro) (ENA) Antibody, IgG: <1 AI

## 2022-05-17 LAB — SJOGRENS SYNDROME-B EXTRACTABLE NUCLEAR ANTIBODY: SSB (La) (ENA) Antibody, IgG: <1 AI

## 2022-05-17 LAB — ANA: Anti Nuclear Antibody (ANA): NEGATIVE

## 2022-05-17 LAB — ANTI-SMITH ANTIBODY: ENA SM Ab Ser-aCnc: <1 AI

## 2022-05-17 LAB — ANTI-DNA ANTIBODY, DOUBLE-STRANDED: ds DNA Ab: <1 IU/mL

## 2022-05-18 LAB — INTRINSIC FACTOR ANTIBODIES: Intrinsic Factor: NEGATIVE

## 2022-05-23 ENCOUNTER — Encounter: Payer: Self-pay | Admitting: Family Medicine

## 2022-05-26 ENCOUNTER — Ambulatory Visit
Admission: RE | Admit: 2022-05-26 | Discharge: 2022-05-26 | Disposition: A | Discharge status: Home / Self Care | Payer: BC Managed Care – PPO | Source: Ambulatory Visit | Attending: Family Medicine | Admitting: Family Medicine

## 2022-05-26 ENCOUNTER — Ambulatory Visit: Payer: BC Managed Care – PPO

## 2022-05-26 DIAGNOSIS — N62 Hypertrophy of breast: Secondary | ICD-10-CM | POA: Diagnosis not present

## 2022-05-26 DIAGNOSIS — N6342 Unspecified lump in left breast, subareolar: Secondary | ICD-10-CM

## 2022-05-26 DIAGNOSIS — N644 Mastodynia: Secondary | ICD-10-CM | POA: Diagnosis not present

## 2022-06-22 ENCOUNTER — Telehealth: Payer: Self-pay | Admitting: Family Medicine

## 2022-06-22 NOTE — Telephone Encounter (Signed)
Pt's ins was wondering if mammogram was supposed to be coded as preventative instead of diagnostic. Please advise.

## 2022-06-22 NOTE — Telephone Encounter (Signed)
Called insurance left detailed message to call back for clarification PCP stated was diagnostic mammogram

## 2022-06-23 ENCOUNTER — Other Ambulatory Visit: Payer: Self-pay | Admitting: Family Medicine

## 2022-06-27 NOTE — Telephone Encounter (Signed)
[employer] called and did confirm mammo was diagnostic and not preventative. They will let the patient know

## 2022-06-28 ENCOUNTER — Other Ambulatory Visit: Payer: Self-pay | Admitting: Family Medicine

## 2022-06-28 DIAGNOSIS — I10 Essential (primary) hypertension: Secondary | ICD-10-CM

## 2022-07-11 ENCOUNTER — Other Ambulatory Visit: Payer: Self-pay | Admitting: Family Medicine

## 2022-07-11 DIAGNOSIS — I10 Essential (primary) hypertension: Secondary | ICD-10-CM

## 2022-08-14 IMAGING — DX DG CHEST 2V
2 series · 2 of 2 positions shown · non-contrast
Comparison: X-ray chest 05/24/2021; CT chest 06/14/2021.

CLINICAL DATA: Urology, follow-up. History of kidney cancer.

EXAM:
CHEST - 2 VIEW

[chest pa]
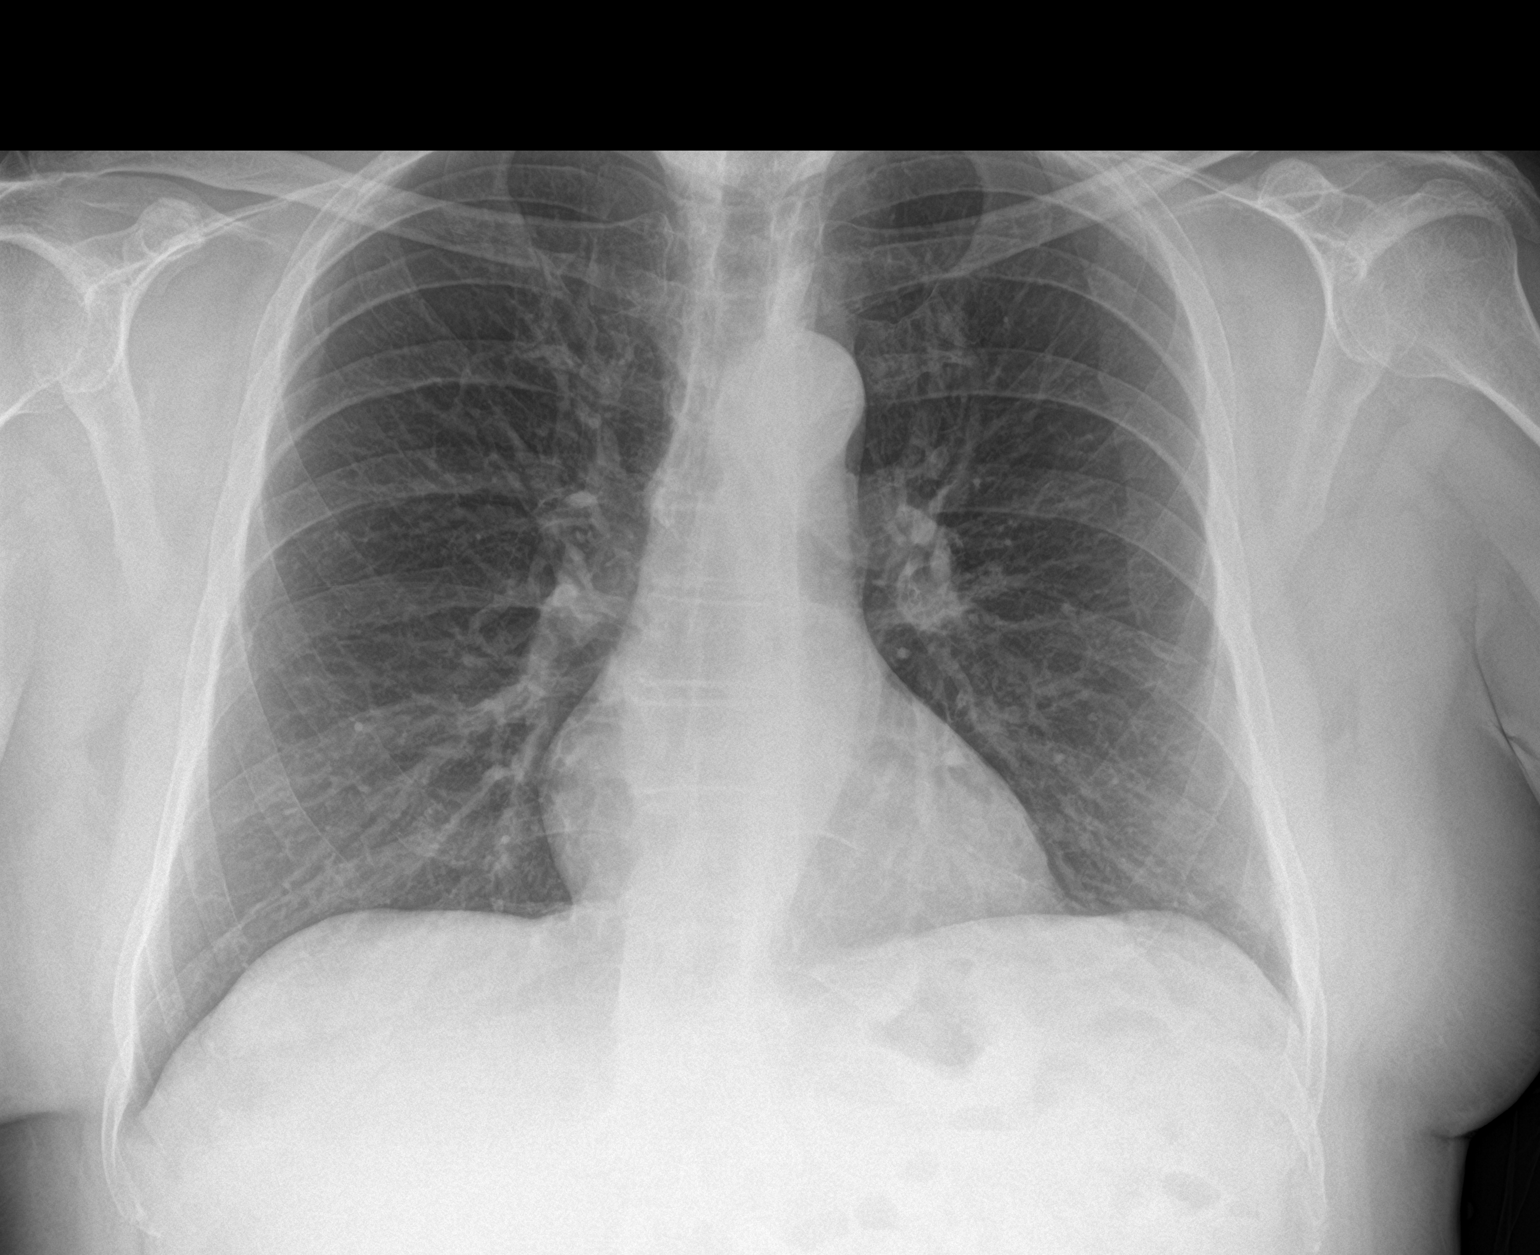

[chest lat]
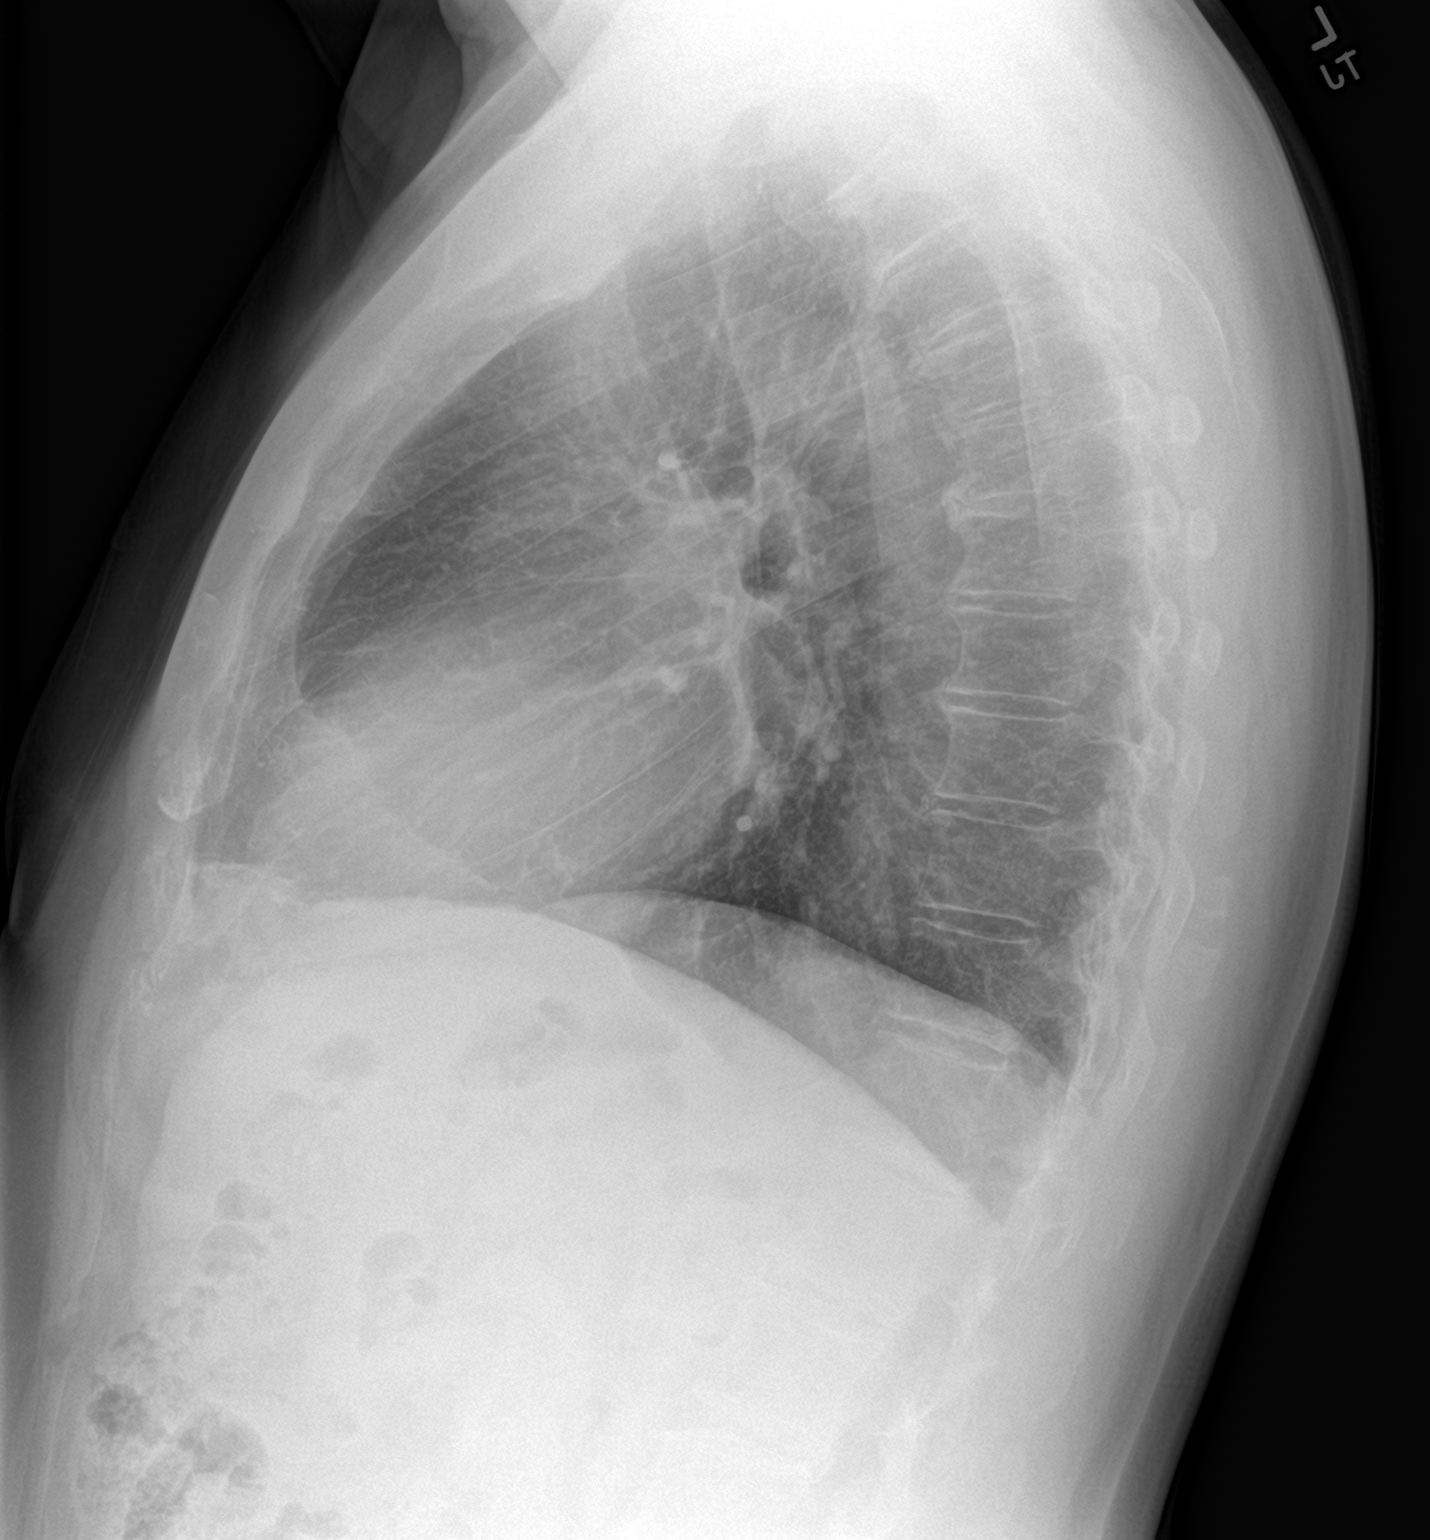

[2 of 2 positions shown; findings below may reference images not displayed]

FINDINGS: The heart size and mediastinal contours are within normal limits.
Both lungs are clear. The visualized skeletal structures are
unremarkable.
IMPRESSION: No active cardiopulmonary disease.

## 2022-09-25 ENCOUNTER — Other Ambulatory Visit: Payer: Self-pay | Admitting: Family Medicine

## 2022-09-25 DIAGNOSIS — I10 Essential (primary) hypertension: Secondary | ICD-10-CM

## 2022-10-30 ENCOUNTER — Other Ambulatory Visit: Payer: Self-pay | Admitting: Family Medicine

## 2022-11-14 ENCOUNTER — Ambulatory Visit: Payer: BC Managed Care – PPO | Admitting: Family Medicine

## 2022-11-14 ENCOUNTER — Encounter: Payer: Self-pay | Admitting: Family Medicine

## 2022-11-14 VITALS — BP 138/85 | HR 66 | Temp 98.5°F | Ht 71.0 in | Wt 197.1 lb

## 2022-11-14 DIAGNOSIS — I10 Essential (primary) hypertension: Secondary | ICD-10-CM | POA: Diagnosis not present

## 2022-11-14 DIAGNOSIS — E785 Hyperlipidemia, unspecified: Secondary | ICD-10-CM

## 2022-11-14 MED ORDER — OLMESARTAN MEDOXOMIL 20 MG PO TABS: 20.0000 mg | ORAL_TABLET | Freq: Every day | ORAL | 2 refills | Status: DC

## 2022-11-14 NOTE — Patient Instructions (Addendum)
Keep the diet clean and stay active.  Give [place] 2-3 business days to get the results of your labs back.   Consider taking Gas-X or Beano for gas relief as needed. An over the counter probiotic may help.  Let us know if you need anything.

## 2022-11-14 NOTE — Progress Notes (Signed)
Chief Complaint  Patient presents with   Follow-up    6 month     Subjective Roberto Martinez is a 61 y.o. male who presents for hypertension follow up. He does monitor home blood pressures. Blood pressures ranging from 130's/80's on average. He is compliant with medications- Aldactone 25 mg/d, chlorthalidone 25 mg/d, Coreg 25 mg bid. Patient has these side effects of medication: none He is adhering to a healthy diet overall. Current exercise: walking No Cp or SOB.  Hyperlipidemia Patient presents for dyslipidemia follow up. Currently being treated with Crestor 20 mg/d and compliance with treatment thus far has been good. He denies myalgias. Diet/exercise as above.  The patient is not known to have coexisting coronary artery disease.   Past Medical History:  Diagnosis Date   History of kidney stones    Hypertension     Exam BP 138/85 (BP Location: Left Arm, Patient Position: Sitting, Cuff Size: Normal)   Pulse 66   Temp 98.5 F (36.9 C) (Oral)   Ht '5\' 11"'$  (1.803 m)   Wt 197 lb 2 oz (89.4 kg)   SpO2 96%   BMI 27.49 kg/m  General:  well developed, well nourished, in no apparent distress Heart: RRR, no bruits, no LE edema Lungs: clear to auscultation, no accessory muscle use Psych: well oriented with normal range of affect and appropriate judgment/insight  Essential hypertension - Plan: olmesartan (BENICAR) 20 MG tablet  Hyperlipidemia, unspecified hyperlipidemia type - Plan: Comprehensive metabolic panel, Lipid panel  Chronic, technically stable.  I would like his blood pressure little bit lower.  Add Benicar 20 mg daily.  Continue chlorthalidone 20 mg daily, Aldactone 25 mg daily, carvedilol 25 mg twice daily.  Monitor blood pressures at home.  Recheck labs in 1 week.  Counseled on diet and exercise. Chronic, stable.  Continue Crestor 20 mg daily. F/u in 6 months for physical or as needed. The patient voiced understanding and agreement to the plan.  [place], DO 11/14/22  4:20 PM

## 2022-11-20 ENCOUNTER — Other Ambulatory Visit (INDEPENDENT_AMBULATORY_CARE_PROVIDER_SITE_OTHER): Payer: BC Managed Care – PPO

## 2022-11-20 DIAGNOSIS — E785 Hyperlipidemia, unspecified: Secondary | ICD-10-CM

## 2022-11-20 LAB — COMPREHENSIVE METABOLIC PANEL
ALT: 15 U/L (ref 0–53)
AST: 14 U/L (ref 0–37)
Albumin: 4.3 g/dL (ref 3.5–5.2)
Alkaline Phosphatase: 52 U/L (ref 39–117)
BUN: 17 mg/dL (ref 6–23)
CO2: 28 mEq/L (ref 19–32)
Calcium: 9.1 mg/dL (ref 8.4–10.5)
Chloride: 104 mEq/L (ref 96–112)
Creatinine, Ser: 1.23 mg/dL (ref 0.40–1.50)
GFR: 63.6 mL/min (ref >60.00)
Glucose, Bld: 88 mg/dL (ref 70–99)
Potassium: 4.7 mEq/L (ref 3.5–5.1)
Sodium: 137 mEq/L (ref 135–145)
Total Bilirubin: 0.8 mg/dL (ref 0.2–1.2)
Total Protein: 6.8 g/dL (ref 6.0–8.3)

## 2022-11-20 LAB — LIPID PANEL
Cholesterol: 107 mg/dL (ref 0–200)
HDL: 40.4 mg/dL (ref >39.00)
LDL Cholesterol: 45 mg/dL (ref 0–99)
NonHDL: 66.28
Total CHOL/HDL Ratio: 3
Triglycerides: 105 mg/dL (ref 0.0–149.0)
VLDL: 21 mg/dL (ref 0.0–40.0)

## 2022-12-03 ENCOUNTER — Other Ambulatory Visit (HOSPITAL_COMMUNITY): Payer: Self-pay | Admitting: Urology

## 2022-12-03 ENCOUNTER — Ambulatory Visit (HOSPITAL_COMMUNITY)
Admission: RE | Admit: 2022-12-03 | Discharge: 2022-12-03 | Disposition: A | Discharge status: Home / Self Care | Payer: BC Managed Care – PPO | Source: Ambulatory Visit | Attending: Urology | Admitting: Urology

## 2022-12-03 DIAGNOSIS — Z125 Encounter for screening for malignant neoplasm of prostate: Secondary | ICD-10-CM | POA: Diagnosis not present

## 2022-12-03 DIAGNOSIS — C641 Malignant neoplasm of right kidney, except renal pelvis: Secondary | ICD-10-CM | POA: Insufficient documentation

## 2022-12-04 DIAGNOSIS — C641 Malignant neoplasm of right kidney, except renal pelvis: Secondary | ICD-10-CM | POA: Diagnosis not present

## 2022-12-04 DIAGNOSIS — Z905 Acquired absence of kidney: Secondary | ICD-10-CM | POA: Diagnosis not present

## 2022-12-06 DIAGNOSIS — C641 Malignant neoplasm of right kidney, except renal pelvis: Secondary | ICD-10-CM | POA: Diagnosis not present

## 2022-12-16 ENCOUNTER — Encounter: Payer: Self-pay | Admitting: Family Medicine

## 2022-12-18 ENCOUNTER — Other Ambulatory Visit: Payer: Self-pay | Admitting: Family Medicine

## 2022-12-20 DIAGNOSIS — C641 Malignant neoplasm of right kidney, except renal pelvis: Secondary | ICD-10-CM | POA: Diagnosis not present

## 2022-12-20 DIAGNOSIS — Z125 Encounter for screening for malignant neoplasm of prostate: Secondary | ICD-10-CM | POA: Diagnosis not present

## 2022-12-20 DIAGNOSIS — E291 Testicular hypofunction: Secondary | ICD-10-CM | POA: Diagnosis not present

## 2022-12-27 ENCOUNTER — Other Ambulatory Visit: Payer: Self-pay | Admitting: Family Medicine

## 2022-12-27 DIAGNOSIS — I10 Essential (primary) hypertension: Secondary | ICD-10-CM

## 2023-01-29 DIAGNOSIS — E291 Testicular hypofunction: Secondary | ICD-10-CM | POA: Diagnosis not present

## 2023-02-10 ENCOUNTER — Other Ambulatory Visit: Payer: Self-pay | Admitting: Family Medicine

## 2023-02-10 DIAGNOSIS — I10 Essential (primary) hypertension: Secondary | ICD-10-CM

## 2023-03-25 ENCOUNTER — Other Ambulatory Visit: Payer: Self-pay | Admitting: Family Medicine

## 2023-03-25 DIAGNOSIS — I10 Essential (primary) hypertension: Secondary | ICD-10-CM

## 2023-04-20 ENCOUNTER — Other Ambulatory Visit: Payer: Self-pay | Admitting: Family Medicine

## 2023-04-20 DIAGNOSIS — I10 Essential (primary) hypertension: Secondary | ICD-10-CM

## 2023-04-22 DIAGNOSIS — E291 Testicular hypofunction: Secondary | ICD-10-CM | POA: Diagnosis not present

## 2023-04-22 DIAGNOSIS — C641 Malignant neoplasm of right kidney, except renal pelvis: Secondary | ICD-10-CM | POA: Diagnosis not present

## 2023-05-06 ENCOUNTER — Encounter: Payer: Self-pay | Admitting: Family Medicine

## 2023-05-06 ENCOUNTER — Ambulatory Visit: Payer: BC Managed Care – PPO | Admitting: Family Medicine

## 2023-05-06 VITALS — BP 121/81 | HR 86 | Temp 98.6°F | Ht 71.0 in | Wt 197.4 lb

## 2023-05-06 DIAGNOSIS — L255 Unspecified contact dermatitis due to plants, except food: Secondary | ICD-10-CM

## 2023-05-06 MED ORDER — PREDNISONE 20 MG PO TABS: ORAL_TABLET | ORAL | 0 refills | Status: DC

## 2023-05-06 NOTE — Progress Notes (Signed)
Chief Complaint  Patient presents with   Poison Ivy    Roberto Martinez is a 62 y.o. male here for a skin complaint.  Duration: 1 week Location: R forearm Pruritic? Yes Painful? No Drainage? Yes- clear drainage Was outside doing yard work, may have gotten into poison ivy New soaps/lotions/topicals/detergents? No Sick contacts? No Other associated symptoms: Blistering, seems to be spreading Therapies tried thus far: OTC cortisone cream, Aloe vera  Past Medical History:  Diagnosis Date   History of kidney stones    Hypertension     BP 121/81 (BP Location: Left Arm, Patient Position: Sitting, Cuff Size: Normal)   Pulse 86   Temp 98.6 F (37 C) (Oral)   Ht 5\' 11"  (1.803 m)   Wt 197 lb 6 oz (89.5 kg)   SpO2 99%   BMI 27.53 kg/m  Gen: awake, alert, appearing stated age Lungs: No accessory muscle use Skin: See below. No drainage, erythema, TTP, fluctuance. Psych: Age appropriate judgment and insight       Rhus dermatitis - Plan: predniSONE (DELTASONE) 20 MG tablet  Given we are a week in, will do a 2 week pred taper, 40 mg/d for a week and the 20 mg/d for a week. Don't scratch. Avoid scented products.  F/u prn. The patient voiced understanding and agreement to the plan.  Jilda Roche [place], DO 05/06/23 11:09 AM

## 2023-05-06 NOTE — Patient Instructions (Addendum)
Try not to scratch as this can make things worse. Avoid scented products while dealing with this. You may resume when the itchiness resolves. Cold/cool compresses can help.   OK to continue the aloe vera.   Let us know if you need anything.

## 2023-05-17 ENCOUNTER — Encounter: Payer: Self-pay | Admitting: Family Medicine

## 2023-05-17 ENCOUNTER — Ambulatory Visit (INDEPENDENT_AMBULATORY_CARE_PROVIDER_SITE_OTHER): Payer: BC Managed Care – PPO | Admitting: Family Medicine

## 2023-05-17 ENCOUNTER — Other Ambulatory Visit: Payer: Self-pay | Admitting: Family Medicine

## 2023-05-17 VITALS — BP 131/84 | HR 50 | Temp 98.0°F | Ht 71.0 in | Wt 193.1 lb

## 2023-05-17 DIAGNOSIS — N401 Enlarged prostate with lower urinary tract symptoms: Secondary | ICD-10-CM

## 2023-05-17 DIAGNOSIS — E871 Hypo-osmolality and hyponatremia: Secondary | ICD-10-CM

## 2023-05-17 DIAGNOSIS — Z125 Encounter for screening for malignant neoplasm of prostate: Secondary | ICD-10-CM

## 2023-05-17 DIAGNOSIS — R351 Nocturia: Secondary | ICD-10-CM

## 2023-05-17 DIAGNOSIS — D72829 Elevated white blood cell count, unspecified: Secondary | ICD-10-CM

## 2023-05-17 DIAGNOSIS — Z0000 Encounter for general adult medical examination without abnormal findings: Secondary | ICD-10-CM | POA: Diagnosis not present

## 2023-05-17 DIAGNOSIS — Z1211 Encounter for screening for malignant neoplasm of colon: Secondary | ICD-10-CM

## 2023-05-17 LAB — PSA: PSA: 0.56 ng/mL (ref 0.10–4.00)

## 2023-05-17 LAB — COMPREHENSIVE METABOLIC PANEL
ALT: 20 U/L (ref 0–53)
AST: 13 U/L (ref 0–37)
Albumin: 4.4 g/dL (ref 3.5–5.2)
Alkaline Phosphatase: 45 U/L (ref 39–117)
BUN: 26 mg/dL — ABNORMAL HIGH (ref 6–23)
CO2: 27 mEq/L (ref 19–32)
Calcium: 9.6 mg/dL (ref 8.4–10.5)
Chloride: 98 mEq/L (ref 96–112)
Creatinine, Ser: 1.27 mg/dL (ref 0.40–1.50)
GFR: 60.99 mL/min (ref >60.00)
Glucose, Bld: 77 mg/dL (ref 70–99)
Potassium: 4.6 mEq/L (ref 3.5–5.1)
Sodium: 133 mEq/L — ABNORMAL LOW (ref 135–145)
Total Bilirubin: 1.1 mg/dL (ref 0.2–1.2)
Total Protein: 7.3 g/dL (ref 6.0–8.3)

## 2023-05-17 LAB — LIPID PANEL
Cholesterol: 123 mg/dL (ref 0–200)
HDL: 60 mg/dL (ref >39.00)
LDL Cholesterol: 42 mg/dL (ref 0–99)
NonHDL: 63.35
Total CHOL/HDL Ratio: 2
Triglycerides: 108 mg/dL (ref 0.0–149.0)
VLDL: 21.6 mg/dL (ref 0.0–40.0)

## 2023-05-17 LAB — CBC
HCT: 40.5 % (ref 39.0–52.0)
Hemoglobin: 13.1 g/dL (ref 13.0–17.0)
MCHC: 32.2 g/dL (ref 30.0–36.0)
MCV: 81.6 fl (ref 78.0–100.0)
Platelets: 242 10*3/uL (ref 150.0–400.0)
RBC: 4.96 Mil/uL (ref 4.22–5.81)
RDW: 14.6 % (ref 11.5–15.5)
WBC: 12.8 10*3/uL — ABNORMAL HIGH (ref 4.0–10.5)

## 2023-05-17 MED ORDER — TAMSULOSIN HCL 0.4 MG PO CAPS
0.4000 mg | ORAL_CAPSULE | Freq: Every day | ORAL | 3 refills | Status: DC
Start: 2023-05-17 — End: 2023-06-17

## 2023-05-17 NOTE — Patient Instructions (Signed)
Give us 2-3 business days to get the results of your labs back.  ? ?Keep the diet clean and stay active. ? ?Please get me a copy of your advanced directive form at your convenience.  ? ?If you do not hear anything about your referral in the next 1-2 weeks, call our office and ask for an update. ? ?Let us know if you need anything. ?

## 2023-05-17 NOTE — Progress Notes (Signed)
Chief Complaint  Patient presents with   Annual Exam    Well Male Roberto Martinez is here for a complete physical.   His last physical was >1 year ago.  Current diet: in general, a "healthy" diet.  Current exercise: wt lifting, cardio, walking Weight trend: intentionally down a few lbs Fatigue out of ordinary? No. Seat belt? Yes.   Advanced directive? Yes  Health maintenance Shingrix- Yes Colonoscopy- Yes Tetanus- Yes HIV- No Hep C- No  Nocturia Going 4x/night urinating over past 2 years. Has never tried anything. No caffeine after noon or alcohol. Drinks a lot during the day. Nothing within 90 min of bedtime. No pain, drainage, bleeding, constipation.    Past Medical History:  Diagnosis Date   History of kidney stones    Hypertension       Past Surgical History:  Procedure Laterality Date   APPENDECTOMY  1986   ROBOTIC ASSITED PARTIAL NEPHRECTOMY Right 07/12/2021   Procedure: XI ROBOTIC ASSITED PARTIAL NEPHRECTOMY;  Surgeon: Rene Paci, MD;  Location: [facility];  Service: Urology;  Laterality: Right;    Medications  Current Outpatient Medications on File Prior to Visit  Medication Sig Dispense Refill   carvedilol (COREG) 25 MG tablet TAKE 1 TABLET (25 MG TOTAL) BY MOUTH 2 (TWO) TIMES DAILY WITH A MEAL. 180 tablet 2   chlorthalidone (HYGROTON) 25 MG tablet TAKE 1 TABLET (25 MG TOTAL) BY MOUTH DAILY. 90 tablet 1   Potassium 99 MG TABS Take 99 mg by mouth 3 (three) times a week.     predniSONE (DELTASONE) 20 MG tablet Take 2 tabs daily for a week and then 1 tab daily for a week 21 tablet 0   rosuvastatin (CRESTOR) 20 MG tablet TAKE 1 TABLET BY MOUTH EVERY DAY 90 tablet 3   spironolactone (ALDACTONE) 25 MG tablet TAKE 1 TABLET (25 MG TOTAL) BY MOUTH DAILY. 90 tablet 0    Allergies No Known Allergies  Family History Family History  Problem Relation Age of Onset   Early death Mother    Hypertension Father    Breast cancer Sister    Cancer Sister         breast cancer   Cancer Brother     Review of Systems: Constitutional:  no fevers Eye:  no recent significant change in vision Ear/Nose/Mouth/Throat:  Ears:  no hearing loss Nose/Mouth/Throat:  no complaints of nasal congestion, no sore throat Cardiovascular:  no chest pain Respiratory:  no shortness of breath Gastrointestinal:  no change in bowel habits GU:  Male: negative for dysuria, +frequency Musculoskeletal/Extremities:  no joint pain Integumentary (Skin/Breast):  no abnormal skin lesions reported Neurologic:  no headaches Endocrine: No unexpected weight changes Hematologic/Lymphatic:  no abnormal bleeding  Exam BP 131/84 (BP Location: Left Arm, Patient Position: Sitting, Cuff Size: Normal)   Pulse (!) 50   Temp 98 F (36.7 C) (Oral)   Ht 5\' 11"  (1.803 m)   Wt 193 lb 2 oz (87.6 kg)   SpO2 99%   BMI 26.94 kg/m  General:  well developed, well nourished, in no apparent distress Skin:  no significant moles, warts, or growths Head:  no masses, lesions, or tenderness Eyes:  pupils equal and round, sclera anicteric without injection Ears:  canals without lesions, TMs shiny without retraction, no obvious effusion, no erythema Nose:  nares patent, mucosa normal Throat/Pharynx:  lips and gingiva without lesion; tongue and uvula midline; non-inflamed pharynx; no exudates or postnasal drainage Neck: neck supple without adenopathy, thyromegaly,  or masses Cardiac: reg rhythm, bradycardic, no bruits, no LE edema Lungs:  clear to auscultation, breath sounds equal bilaterally, no respiratory distress Abdomen: BS+, soft, non-tender, non-distended, no masses or organomegaly noted Rectal: Deferred Musculoskeletal:  symmetrical muscle groups noted without atrophy or deformity Neuro:  gait normal; deep tendon reflexes normal and symmetric Psych: well oriented with normal range of affect and appropriate judgment/insight  Assessment and Plan  Well adult exam  Screen for colon cancer -  Plan: Ambulatory referral to Gastroenterology   Well 62 y.o. male. Counseled on diet and exercise. Counseled on risks and benefits of prostate cancer screening with PSA. The patient agrees to undergo testing. Advanced directive requested.  Immunizations, labs, and further orders as above. Refer [facility] for f/u ccs.  BPH: Chronic, uncontrolled. Start Flomax 0.4 mg/d. No fluids within 90 min of bedtime. F/u in 1 mo.  The patient voiced understanding and agreement to the plan.  Jilda Roche [place], DO 05/17/23 10:04 AM

## 2023-05-20 ENCOUNTER — Other Ambulatory Visit: Payer: Self-pay | Admitting: Family Medicine

## 2023-05-20 DIAGNOSIS — I10 Essential (primary) hypertension: Secondary | ICD-10-CM

## 2023-05-24 ENCOUNTER — Other Ambulatory Visit (INDEPENDENT_AMBULATORY_CARE_PROVIDER_SITE_OTHER): Payer: BC Managed Care – PPO

## 2023-05-24 DIAGNOSIS — E871 Hypo-osmolality and hyponatremia: Secondary | ICD-10-CM

## 2023-05-24 DIAGNOSIS — D72829 Elevated white blood cell count, unspecified: Secondary | ICD-10-CM | POA: Diagnosis not present

## 2023-05-24 DIAGNOSIS — D649 Anemia, unspecified: Secondary | ICD-10-CM | POA: Diagnosis not present

## 2023-05-24 LAB — CBC WITH DIFFERENTIAL/PLATELET
Absolute Monocytes: 531 cells/uL (ref 200–950)
Basophils Absolute: 63 cells/uL (ref 0–200)
Basophils Relative: 0.7 %
Hemoglobin: 12.8 g/dL — ABNORMAL LOW (ref 13.2–17.1)
Monocytes Relative: 5.9 %
WBC: 9 10*3/uL (ref 3.8–10.8)

## 2023-05-24 NOTE — Addendum Note (Signed)
Addended by: Mervin Kung A on: 05/24/2023 03:19 PM   Modules accepted: Orders

## 2023-05-25 LAB — BASIC METABOLIC PANEL: Glucose, Bld: 99 mg/dL (ref 65–99)

## 2023-05-25 LAB — CBC WITH DIFFERENTIAL/PLATELET
MCHC: 32.6 g/dL (ref 32.0–36.0)
Neutro Abs: 5238 cells/uL (ref 1500–7800)
Neutrophils Relative %: 58.2 %
Platelets: 197 10*3/uL (ref 140–400)

## 2023-05-27 ENCOUNTER — Other Ambulatory Visit: Payer: Self-pay | Admitting: Family Medicine

## 2023-05-27 DIAGNOSIS — D649 Anemia, unspecified: Secondary | ICD-10-CM

## 2023-05-28 ENCOUNTER — Other Ambulatory Visit: Payer: Self-pay | Admitting: Family Medicine

## 2023-05-28 DIAGNOSIS — E611 Iron deficiency: Secondary | ICD-10-CM

## 2023-05-28 DIAGNOSIS — D649 Anemia, unspecified: Secondary | ICD-10-CM

## 2023-05-28 LAB — CBC WITH DIFFERENTIAL/PLATELET
Eosinophils Absolute: 171 cells/uL (ref 15–500)
Eosinophils Relative: 1.9 %
HCT: 39.3 % (ref 38.5–50.0)
Lymphs Abs: 2997 cells/uL (ref 850–3900)
MCH: 26.6 pg — ABNORMAL LOW (ref 27.0–33.0)
MCV: 81.7 fL (ref 80.0–100.0)
MPV: 11.8 fL (ref 7.5–12.5)
RBC: 4.81 10*6/uL (ref 4.20–5.80)
RDW: 13.5 % (ref 11.0–15.0)
Total Lymphocyte: 33.3 %

## 2023-05-28 LAB — TEST AUTHORIZATION

## 2023-05-28 LAB — IRON,TIBC AND FERRITIN PANEL
%SAT: 16 % (calc) — ABNORMAL LOW (ref 20–48)
Ferritin: 9 ng/mL — ABNORMAL LOW (ref 24–380)
Iron: 70 ug/dL (ref 50–180)
TIBC: 451 mcg/dL (calc) — ABNORMAL HIGH (ref 250–425)

## 2023-05-28 LAB — BASIC METABOLIC PANEL
BUN: 21 mg/dL (ref 7–25)
CO2: 26 mmol/L (ref 20–32)
Calcium: 9.5 mg/dL (ref 8.6–10.3)
Chloride: 102 mmol/L (ref 98–110)
Creat: 1.17 mg/dL (ref 0.70–1.35)
Potassium: 4.5 mmol/L (ref 3.5–5.3)
Sodium: 136 mmol/L (ref 135–146)

## 2023-05-29 ENCOUNTER — Other Ambulatory Visit: Payer: BC Managed Care – PPO

## 2023-05-29 ENCOUNTER — Other Ambulatory Visit (INDEPENDENT_AMBULATORY_CARE_PROVIDER_SITE_OTHER): Payer: BC Managed Care – PPO

## 2023-05-29 DIAGNOSIS — D649 Anemia, unspecified: Secondary | ICD-10-CM

## 2023-05-29 DIAGNOSIS — E611 Iron deficiency: Secondary | ICD-10-CM | POA: Diagnosis not present

## 2023-05-29 LAB — URINALYSIS, MICROSCOPIC ONLY

## 2023-05-29 LAB — IBC + FERRITIN
Ferritin: 9.6 ng/mL — ABNORMAL LOW (ref 22.0–322.0)
Iron: 56 ug/dL (ref 42–165)
Saturation Ratios: 11.3 % — ABNORMAL LOW (ref 20.0–50.0)
TIBC: 497 ug/dL — ABNORMAL HIGH (ref 250.0–450.0)
Transferrin: 355 mg/dL (ref 212.0–360.0)

## 2023-05-29 NOTE — Addendum Note (Signed)
Addended by: Rosita Kea on: 05/29/2023 07:48 AM   Modules accepted: Orders

## 2023-06-03 ENCOUNTER — Other Ambulatory Visit: Payer: BC Managed Care – PPO

## 2023-06-03 DIAGNOSIS — E611 Iron deficiency: Secondary | ICD-10-CM | POA: Diagnosis not present

## 2023-06-03 DIAGNOSIS — D649 Anemia, unspecified: Secondary | ICD-10-CM

## 2023-06-03 NOTE — Addendum Note (Signed)
Addended by: Mervin Kung A on: 06/03/2023 12:14 PM   Modules accepted: Orders

## 2023-06-04 LAB — FECAL OCCULT BLOOD, IMMUNOCHEMICAL: Fecal Occult Bld: NEGATIVE

## 2023-06-17 ENCOUNTER — Encounter: Payer: Self-pay | Admitting: Family Medicine

## 2023-06-17 ENCOUNTER — Ambulatory Visit: Payer: BC Managed Care – PPO | Admitting: Family Medicine

## 2023-06-17 DIAGNOSIS — N401 Enlarged prostate with lower urinary tract symptoms: Secondary | ICD-10-CM | POA: Diagnosis not present

## 2023-06-17 DIAGNOSIS — R351 Nocturia: Secondary | ICD-10-CM | POA: Diagnosis not present

## 2023-06-17 MED ORDER — TAMSULOSIN HCL 0.4 MG PO CAPS
0.8000 mg | ORAL_CAPSULE | Freq: Every day | ORAL | 2 refills | Status: DC
Start: 2023-06-17 — End: 2023-08-14

## 2023-06-17 NOTE — Patient Instructions (Signed)
Take 2 capsules until you run out. A new prescription with the updated quantity has been sent to the pharmacy.   Let us know if you need anything.

## 2023-06-17 NOTE — Progress Notes (Signed)
Chief Complaint  Patient presents with   Follow-up    1 month    Subjective: Patient is a 62 y.o. male here for follow-up.  The patient was started on tamsulosin 0.4 mg nightly for nocturia.  He was urinating 5-6 times per night prior to starting the medication and now he is going around 3 times per night.  He was having some dizziness during the first few days of taking the medication but he is no longer having this.  He reports compliance.  No excessive caffeine or alcohol use.  He is not having any bleeding, constipation, fevers, or pain.  Tamsulosin is the first pharmacologic treatment he has tried for this.  Past Medical History:  Diagnosis Date   History of kidney stones    Hypertension     Objective: BP 120/72 (BP Location: Left Arm, Patient Position: Sitting, Cuff Size: Normal)   Pulse 71   Temp 98.3 F (36.8 C) (Oral)   Ht 5\' 11"  (1.803 m)   Wt 197 lb 8 oz (89.6 kg)   SpO2 99%   BMI 27.55 kg/m  General: Awake, appears stated age Heart: RRR, no LE edema Lungs: CTAB, no rales, wheezes or rhonchi. No accessory muscle use Abdomen: Bowel sounds present, soft, nontender, nondistended Psych: Age appropriate judgment and insight, normal affect and mood  Assessment and Plan: Benign prostatic hyperplasia with nocturia - Plan: tamsulosin (FLOMAX) 0.4 MG CAPS capsule  Chronic, uncontrolled.  Increase Flomax from 0.4 mg nightly to 0.8 mg nightly.  He will let me know if there are any issues.  Mind fluid intake prior to bedtime.  Follow-up in 6 months for a medication check or as needed. The patient voiced understanding and agreement to the plan.  Jilda Roche [place], DO 06/17/23  4:04 PM

## 2023-06-29 ENCOUNTER — Other Ambulatory Visit: Payer: Self-pay | Admitting: Family Medicine

## 2023-06-29 DIAGNOSIS — I10 Essential (primary) hypertension: Secondary | ICD-10-CM

## 2023-08-14 ENCOUNTER — Other Ambulatory Visit: Payer: Self-pay | Admitting: Family Medicine

## 2023-08-14 DIAGNOSIS — N401 Enlarged prostate with lower urinary tract symptoms: Secondary | ICD-10-CM

## 2023-10-01 ENCOUNTER — Other Ambulatory Visit: Payer: Self-pay | Admitting: Family Medicine

## 2023-10-01 DIAGNOSIS — I10 Essential (primary) hypertension: Secondary | ICD-10-CM

## 2023-10-31 ENCOUNTER — Other Ambulatory Visit: Payer: Self-pay | Admitting: Family Medicine

## 2023-12-04 ENCOUNTER — Ambulatory Visit (HOSPITAL_COMMUNITY)
Admission: RE | Admit: 2023-12-04 | Discharge: 2023-12-04 | Disposition: A | Discharge status: Home / Self Care | Payer: BC Managed Care – PPO | Source: Ambulatory Visit | Attending: Urology | Admitting: Urology

## 2023-12-04 ENCOUNTER — Other Ambulatory Visit (HOSPITAL_COMMUNITY): Payer: Self-pay | Admitting: Urology

## 2023-12-04 DIAGNOSIS — C641 Malignant neoplasm of right kidney, except renal pelvis: Secondary | ICD-10-CM | POA: Insufficient documentation

## 2023-12-04 DIAGNOSIS — R918 Other nonspecific abnormal finding of lung field: Secondary | ICD-10-CM | POA: Diagnosis not present

## 2023-12-04 DIAGNOSIS — Z125 Encounter for screening for malignant neoplasm of prostate: Secondary | ICD-10-CM | POA: Diagnosis not present

## 2023-12-16 DIAGNOSIS — K7689 Other specified diseases of liver: Secondary | ICD-10-CM | POA: Diagnosis not present

## 2023-12-16 DIAGNOSIS — Z905 Acquired absence of kidney: Secondary | ICD-10-CM | POA: Diagnosis not present

## 2023-12-16 DIAGNOSIS — C641 Malignant neoplasm of right kidney, except renal pelvis: Secondary | ICD-10-CM | POA: Diagnosis not present

## 2023-12-20 ENCOUNTER — Ambulatory Visit: Payer: BC Managed Care – PPO | Admitting: Family Medicine

## 2023-12-20 ENCOUNTER — Encounter: Payer: Self-pay | Admitting: Family Medicine

## 2023-12-20 VITALS — BP 128/76 | HR 67 | Temp 98.0°F | Resp 16 | Ht 71.0 in | Wt 199.0 lb

## 2023-12-20 DIAGNOSIS — E785 Hyperlipidemia, unspecified: Secondary | ICD-10-CM | POA: Diagnosis not present

## 2023-12-20 DIAGNOSIS — I10 Essential (primary) hypertension: Secondary | ICD-10-CM

## 2023-12-20 NOTE — Progress Notes (Signed)
 Chief Complaint  Patient presents with   Medication Refill    Medication check    Subjective Roberto Martinez is a 63 y.o. male who presents for hypertension follow up. He does monitor home blood pressures. Blood pressures ranging from 130's/70's on average. He is compliant with medications- Coreg  25 mg bid, chlorthalidone  25 mg/d, Aldactone  25 mg/d. Patient has these side effects of medication: none He is usually adhering to a healthy diet overall. Current exercise: cardio, wt lifting No CP or SOB.   Hyperlipidemia Patient presents for hyperlipidemia follow up. Currently being treated with Crestor  20 mg/d and compliance with treatment thus far has been good. He denies myalgias. Diet/exercise as above. The patient is known to have coexisting coronary artery disease.   Past Medical History:  Diagnosis Date   History of kidney stones    Hypertension     Exam BP 128/76   Pulse 67   Temp 98 F (36.7 C) (Oral)   Resp 16   Ht 5' 11 (1.803 m)   Wt 199 lb (90.3 kg)   SpO2 96%   BMI 27.75 kg/m  General:  well developed, well nourished, in no apparent distress Heart: RRR, no bruits, no LE edema Lungs: clear to auscultation, no accessory muscle use Psych: well oriented with normal range of affect and appropriate judgment/insight  Essential hypertension  Hyperlipidemia, unspecified hyperlipidemia type  Chronic, stable. Cont Coreg  25 mg bid, chlorthalidone  25 mg/d, Aldactone  25 mg/d. Counseled on diet and exercise. Chronic, stable. Cont Crestor  20 mg/d.  F/u in 6 mo. The patient voiced understanding and agreement to the plan.  [name], DO 12/20/23  4:10 PM

## 2023-12-20 NOTE — Patient Instructions (Addendum)
 Keep the diet clean and stay active.  Please contact the GI team at: [phone]  Let us know if you need anything.

## 2023-12-28 ENCOUNTER — Other Ambulatory Visit: Payer: Self-pay | Admitting: Family Medicine

## 2023-12-28 DIAGNOSIS — I10 Essential (primary) hypertension: Secondary | ICD-10-CM

## 2023-12-30 DIAGNOSIS — N401 Enlarged prostate with lower urinary tract symptoms: Secondary | ICD-10-CM | POA: Diagnosis not present

## 2023-12-30 DIAGNOSIS — N528 Other male erectile dysfunction: Secondary | ICD-10-CM | POA: Diagnosis not present

## 2023-12-30 DIAGNOSIS — R351 Nocturia: Secondary | ICD-10-CM | POA: Diagnosis not present

## 2023-12-30 DIAGNOSIS — C641 Malignant neoplasm of right kidney, except renal pelvis: Secondary | ICD-10-CM | POA: Diagnosis not present

## 2024-03-07 ENCOUNTER — Encounter: Payer: Self-pay | Admitting: Family Medicine

## 2024-03-27 ENCOUNTER — Other Ambulatory Visit: Payer: Self-pay | Admitting: Family Medicine

## 2024-03-27 DIAGNOSIS — I10 Essential (primary) hypertension: Secondary | ICD-10-CM

## 2024-04-28 ENCOUNTER — Other Ambulatory Visit: Payer: Self-pay | Admitting: Family Medicine

## 2024-04-28 DIAGNOSIS — I10 Essential (primary) hypertension: Secondary | ICD-10-CM

## 2024-06-22 ENCOUNTER — Ambulatory Visit: Payer: Self-pay | Admitting: Family Medicine

## 2024-06-22 ENCOUNTER — Encounter: Payer: BC Managed Care – PPO | Admitting: Family Medicine

## 2024-06-22 ENCOUNTER — Encounter: Payer: Self-pay | Admitting: Family Medicine

## 2024-06-22 ENCOUNTER — Ambulatory Visit (INDEPENDENT_AMBULATORY_CARE_PROVIDER_SITE_OTHER): Payer: BC Managed Care – PPO | Admitting: Family Medicine

## 2024-06-22 VITALS — BP 124/78 | HR 69 | Temp 97.7°F | Resp 16 | Ht 71.0 in | Wt 195.6 lb

## 2024-06-22 DIAGNOSIS — M79644 Pain in right finger(s): Secondary | ICD-10-CM | POA: Diagnosis not present

## 2024-06-22 DIAGNOSIS — Z125 Encounter for screening for malignant neoplasm of prostate: Secondary | ICD-10-CM

## 2024-06-22 DIAGNOSIS — Z0000 Encounter for general adult medical examination without abnormal findings: Secondary | ICD-10-CM | POA: Diagnosis not present

## 2024-06-22 LAB — CBC
HCT: 39.2 % (ref 39.0–52.0)
Hemoglobin: 13 g/dL (ref 13.0–17.0)
MCHC: 33.3 g/dL (ref 30.0–36.0)
MCV: 82.3 fl (ref 78.0–100.0)
Platelets: 190 K/uL (ref 150.0–400.0)
RBC: 4.76 Mil/uL (ref 4.22–5.81)
RDW: 13.5 % (ref 11.5–15.5)
WBC: 7.3 K/uL (ref 4.0–10.5)

## 2024-06-22 LAB — LIPID PANEL
Cholesterol: 118 mg/dL (ref 0–200)
HDL: 41.6 mg/dL (ref >39.00)
LDL Cholesterol: 47 mg/dL (ref 0–99)
NonHDL: 76.89
Total CHOL/HDL Ratio: 3
Triglycerides: 148 mg/dL (ref 0.0–149.0)
VLDL: 29.6 mg/dL (ref 0.0–40.0)

## 2024-06-22 LAB — COMPREHENSIVE METABOLIC PANEL WITH GFR
ALT: 20 U/L (ref 0–53)
AST: 16 U/L (ref 0–37)
Albumin: 4.5 g/dL (ref 3.5–5.2)
Alkaline Phosphatase: 49 U/L (ref 39–117)
BUN: 15 mg/dL (ref 6–23)
CO2: 26 meq/L (ref 19–32)
Calcium: 9.7 mg/dL (ref 8.4–10.5)
Chloride: 102 meq/L (ref 96–112)
Creatinine, Ser: 1.24 mg/dL (ref 0.40–1.50)
GFR: 62.28 mL/min (ref >60.00)
Glucose, Bld: 97 mg/dL (ref 70–99)
Potassium: 4.2 meq/L (ref 3.5–5.1)
Sodium: 137 meq/L (ref 135–145)
Total Bilirubin: 0.8 mg/dL (ref 0.2–1.2)
Total Protein: 7 g/dL (ref 6.0–8.3)

## 2024-06-22 LAB — PSA: PSA: 0.76 ng/mL (ref 0.10–4.00)

## 2024-06-22 LAB — URIC ACID: Uric Acid, Serum: 6.3 mg/dL (ref 4.0–7.8)

## 2024-06-22 NOTE — Patient Instructions (Signed)
 Give us  2-3 business days to get the results of your labs back.   Keep the diet clean and stay active.  If you don't hear from us  in the next 1-2 weeks, please send me a message.  Let us  know if you need anything.

## 2024-06-22 NOTE — Progress Notes (Signed)
 Chief Complaint  Patient presents with   Annual Exam    CPE    Well Male Roberto Martinez is here for a complete physical.   His last physical was >1 year ago.  Current diet: in general, a healthy diet.  Current exercise: cardio, walking, lifting wts Weight trend: stable Fatigue out of ordinary? No. Seat belt? Yes.   Advanced directive? 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 90 tablet 1   Potassium 99 MG TABS Take 99 mg by mouth 3 (three) times a week.     rosuvastatin  (CRESTOR ) 20 MG tablet TAKE 1 TABLET BY MOUTH EVERY DAY 90 tablet 3   spironolactone  (ALDACTONE ) 25 MG tablet Take 1 tablet (25 mg total) by mouth daily. 90 tablet 1    Allergies No Known Allergies  Family History Family History  Problem Relation Age of Onset   Early death Mother    Hypertension Father    Breast cancer Sister    Cancer Sister        breast cancer   Cancer Brother     Review of Systems: Constitutional:  no fevers Eye:  no recent significant change in vision Ear/Nose/Mouth/Throat:  Ears:  no hearing loss Nose/Mouth/Throat:  no complaints of nasal congestion, no sore throat Cardiovascular:  no chest pain Respiratory:  no shortness of breath Gastrointestinal:  no change in  bowel habits GU:  Male: negative for dysuria, frequency Musculoskeletal/Extremities:  no current joint pain; did have transient pain  Integumentary (Skin/Breast):  no abnormal skin lesions reported Neurologic:  no headaches Endocrine: No unexpected weight changes Hematologic/Lymphatic:  no abnormal bleeding  Exam BP 124/78 (BP Location: Left Arm, Patient Position: Sitting)   Pulse 69   Temp 97.7 F (36.5 C) (Oral)   Resp 16   Ht 5' 11 (1.803 m)   Wt 195 lb 9.6 oz (88.7 kg)   SpO2 96%   BMI 27.28 kg/m  General:  well developed, well nourished, in no apparent distress Skin:  no significant moles, warts, or growths Head:  no masses, lesions, or tenderness Eyes:  pupils equal and round, sclera anicteric without injection Ears:  canals without lesions, TMs shiny without retraction, no obvious effusion, no erythema Nose:  nares patent, mucosa normal Throat/Pharynx:  lips and gingiva without lesion; tongue and uvula midline; non-inflamed pharynx; no exudates or postnasal drainage Neck: neck supple without adenopathy, thyromegaly, or masses Cardiac: RRR, no bruits, no LE edema Lungs:  clear to auscultation, breath sounds equal bilaterally, no respiratory distress Abdomen: BS+, soft, non-tender, non-distended, no masses or organomegaly noted Rectal: Deferred Musculoskeletal:  symmetrical muscle groups noted without atrophy or deformity Neuro:  gait normal; deep tendon reflexes normal and symmetric Psych: well oriented with normal range  of affect and appropriate judgment/insight  Assessment and Plan  Well adult exam - Plan: CBC, Comprehensive metabolic panel with GFR, Lipid panel  Screening for prostate cancer - Plan: PSA  Finger pain, right - Plan: Uric acid   Well 63 y.o. male. Counseled on diet and exercise. Counseled on risks and benefits of prostate cancer screening with PSA. The patient agrees to undergo testing. Immunizations, labs, and further orders as  above. Intermittent finger pain, monoarthritis. Gout? Doesn't seem likely to be SLE, RA, etc.  Follow up in 6 mo. The patient voiced understanding and agreement to the plan.  Mabel Mt [place], DO 06/22/24 7:21 AM

## 2024-08-21 ENCOUNTER — Encounter: Payer: Self-pay | Admitting: Family Medicine

## 2024-08-21 DIAGNOSIS — Z0000 Encounter for general adult medical examination without abnormal findings: Secondary | ICD-10-CM

## 2024-08-21 DIAGNOSIS — Z1211 Encounter for screening for malignant neoplasm of colon: Secondary | ICD-10-CM

## 2024-09-15 NOTE — Telephone Encounter (Signed)
 Called spoke with pt regarding colonoscopy and advised him to reach to [facility] where he had last test with GI, contact  Information was given to pt and he stated understand, he will get the report from them. [address]  [address]  [place], [place] [address]  [phone] (Work)  [phone] (Fax

## 2024-09-25 ENCOUNTER — Other Ambulatory Visit: Payer: Self-pay | Admitting: Family Medicine

## 2024-09-25 DIAGNOSIS — I10 Essential (primary) hypertension: Secondary | ICD-10-CM

## 2024-10-13 ENCOUNTER — Other Ambulatory Visit: Payer: Self-pay | Admitting: Family Medicine

## 2024-12-02 ENCOUNTER — Encounter: Payer: Self-pay | Admitting: Internal Medicine

## 2024-12-22 ENCOUNTER — Ambulatory Visit (HOSPITAL_COMMUNITY)
Admission: RE | Admit: 2024-12-22 | Discharge: 2024-12-22 | Disposition: A | Discharge status: Home / Self Care | Source: Ambulatory Visit | Attending: Urology | Admitting: Urology

## 2024-12-22 ENCOUNTER — Other Ambulatory Visit (HOSPITAL_COMMUNITY): Payer: Self-pay | Admitting: Urology

## 2024-12-22 DIAGNOSIS — C649 Malignant neoplasm of unspecified kidney, except renal pelvis: Secondary | ICD-10-CM

## 2024-12-22 DIAGNOSIS — C799 Secondary malignant neoplasm of unspecified site: Secondary | ICD-10-CM | POA: Insufficient documentation

## 2024-12-23 ENCOUNTER — Ambulatory Visit: Admitting: Family Medicine

## 2024-12-23 ENCOUNTER — Encounter: Payer: Self-pay | Admitting: Family Medicine

## 2024-12-23 ENCOUNTER — Ambulatory Visit: Payer: Self-pay | Admitting: Family Medicine

## 2024-12-23 VITALS — BP 128/78 | HR 74 | Temp 97.9°F | Resp 16 | Ht 71.0 in | Wt 199.0 lb

## 2024-12-23 DIAGNOSIS — E785 Hyperlipidemia, unspecified: Secondary | ICD-10-CM

## 2024-12-23 DIAGNOSIS — Z23 Encounter for immunization: Secondary | ICD-10-CM | POA: Diagnosis not present

## 2024-12-23 DIAGNOSIS — I10 Essential (primary) hypertension: Secondary | ICD-10-CM

## 2024-12-23 LAB — COMPREHENSIVE METABOLIC PANEL WITH GFR
ALT: 22 U/L (ref 3–53)
AST: 20 U/L (ref 5–37)
Albumin: 4.6 g/dL (ref 3.5–5.2)
Alkaline Phosphatase: 46 U/L (ref 39–117)
BUN: 18 mg/dL (ref 6–23)
CO2: 28 meq/L (ref 19–32)
Calcium: 9.7 mg/dL (ref 8.4–10.5)
Chloride: 102 meq/L (ref 96–112)
Creatinine, Ser: 1.24 mg/dL (ref 0.40–1.50)
GFR: 62.06 mL/min
Glucose, Bld: 100 mg/dL — ABNORMAL HIGH (ref 70–99)
Potassium: 4.4 meq/L (ref 3.5–5.1)
Sodium: 137 meq/L (ref 135–145)
Total Bilirubin: 1.3 mg/dL — ABNORMAL HIGH (ref 0.2–1.2)
Total Protein: 7.2 g/dL (ref 6.0–8.3)

## 2024-12-23 LAB — LIPID PANEL
Cholesterol: 105 mg/dL (ref 28–200)
HDL: 39.7 mg/dL
LDL Cholesterol: 30 mg/dL (ref 10–99)
NonHDL: 64.81
Total CHOL/HDL Ratio: 3
Triglycerides: 174 mg/dL — ABNORMAL HIGH (ref 10.0–149.0)
VLDL: 34.8 mg/dL (ref 0.0–40.0)

## 2024-12-23 MED ORDER — SPIRONOLACTONE 25 MG PO TABS: 25.0000 mg | ORAL_TABLET | Freq: Every day | ORAL | 3 refills | Status: AC

## 2024-12-23 MED ORDER — CHLORTHALIDONE 25 MG PO TABS: 25.0000 mg | ORAL_TABLET | Freq: Every day | ORAL | 3 refills | Status: AC

## 2024-12-23 MED ORDER — CARVEDILOL 25 MG PO TABS: 25.0000 mg | ORAL_TABLET | Freq: Two times a day (BID) | ORAL | 3 refills | Status: AC

## 2024-12-23 MED ORDER — ROSUVASTATIN CALCIUM 20 MG PO TABS: 20.0000 mg | ORAL_TABLET | Freq: Every day | ORAL | 3 refills | Status: AC

## 2024-12-23 NOTE — Progress Notes (Signed)
 Chief Complaint  Patient presents with   Medication Refill    Medication Check     Subjective Roberto Martinez is a 64 y.o. male who presents for hypertension follow up. He does not monitor home blood pressures. He is compliant with medications- Coreg  25 mg bid, Aldactone  25 mg/d, chlorthalidone  25 mg/d. Patient has these side effects of medication: none He is usually adhering to a healthy diet overall. Current exercise: lifting wts, rowing, walking No CP or SOB.   Hyperlipidemia Patient presents for hyperlipidemia follow up. Currently being treated with Crestor  20 mg/d and compliance with treatment thus far has been good. He denies myalgias. Diet/exercise as above. The patient is not known to have coexisting coronary artery disease.   Past Medical History:  Diagnosis Date   History of kidney stones    Hypertension     Exam BP 128/78 (BP Location: Left Arm, Patient Position: Sitting)   Pulse 74   Temp 97.9 F (36.6 C) (Oral)   Resp 16   Ht 5' 11 (1.803 m)   Wt 199 lb (90.3 kg)   SpO2 99%   BMI 27.75 kg/m  General:  well developed, well nourished, in no apparent distress Heart: RRR, no bruits, no LE edema Lungs: clear to auscultation, no accessory muscle use Psych: well oriented with normal range of affect and appropriate judgment/insight  Essential hypertension - Plan: Comprehensive metabolic panel with GFR, Lipid panel, chlorthalidone  (HYGROTON ) 25 MG tablet, spironolactone  (ALDACTONE ) 25 MG tablet, carvedilol  (COREG ) 25 MG tablet  Hyperlipidemia, unspecified hyperlipidemia type - Plan: rosuvastatin  (CRESTOR ) 20 MG tablet  Need for pneumococcal vaccine - Plan: Pneumococcal conjugate vaccine 20-valent (Prevnar 20)  Chronic, stable.  Continue Coreg  25 mg twice daily, spironolactone  25 mg daily, chlorthalidone  25 mg daily.  Counseled on diet and exercise. Chronic, stable.  Continue Crestor  20 mg daily. PCV 20 today. Will look into [facility] issues.  He is looking  to transfer care from [facility] to [place].  Most recent colonoscopy showing polyps in 2018. F/u in 6 months. The patient voiced understanding and agreement to the plan.  Mabel Mt [place], DO 12/23/2024  7:49 AM

## 2024-12-23 NOTE — Patient Instructions (Signed)
 Give us  2-3 business days to get the results of your labs back.   Keep the diet clean and stay active.  Send me a message if you don't hear anything from us  in a week about your colonoscopy.  Let us  know if you need anything.

## 2025-01-07 ENCOUNTER — Encounter: Payer: Self-pay | Admitting: Student

## 2025-01-07 ENCOUNTER — Ambulatory Visit: Admitting: Student

## 2025-01-07 VITALS — BP 144/66 | HR 71 | Temp 100.8°F | Resp 16 | Ht 71.0 in | Wt 199.4 lb

## 2025-01-07 DIAGNOSIS — J101 Influenza due to other identified influenza virus with other respiratory manifestations: Secondary | ICD-10-CM

## 2025-01-07 DIAGNOSIS — I10 Essential (primary) hypertension: Secondary | ICD-10-CM

## 2025-01-07 DIAGNOSIS — R051 Acute cough: Secondary | ICD-10-CM

## 2025-01-07 LAB — POCT RAPID STREP A (OFFICE): Rapid Strep A Screen: NEGATIVE

## 2025-01-07 LAB — POC COVID19/FLU A&B COMBO
Covid Antigen, POC: NEGATIVE
Influenza A Antigen, POC: POSITIVE — AB
Influenza B Antigen, POC: NEGATIVE

## 2025-01-07 MED ORDER — OSELTAMIVIR PHOSPHATE 75 MG PO CAPS: 75.0000 mg | ORAL_CAPSULE | Freq: Two times a day (BID) | ORAL | 0 refills | Status: AC

## 2025-01-07 MED ORDER — BENZONATATE 100 MG PO CAPS: 100.0000 mg | ORAL_CAPSULE | Freq: Three times a day (TID) | ORAL | 0 refills | Status: AC | PRN

## 2025-01-07 MED ORDER — FLUTICASONE PROPIONATE 50 MCG/ACT NA SUSP: 2.0000 | Freq: Every day | NASAL | 6 refills | Status: AC

## 2025-01-07 NOTE — Progress Notes (Addendum)
 Chief Complaint  Patient presents with   Cough    Patient has a cough, possible fever, body aches, chills and headache. Symptoms present for about 2 days.   Roberto Martinez  History of Present Illness Roberto Martinez is a 64 year old male who presents with URI complaints. Onset 2 days ago. Reports +fever, body aches, +cough +nasal drainage. Denies sinus pain/pressure. Denies sick contacts.  Patient denies fever, chills, SOB, CP, palpitations, dyspnea, edema, HA, vision changes, N/V/D, abdominal pain, urinary symptoms, rash.    Past Medical History:  Diagnosis Date   History of kidney stones    Hypertension     Objective BP (!) 144/66 (BP Location: Right Arm, Patient Position: Sitting, Cuff Size: Normal)   Pulse 71   Temp (!) 100.8 F (38.2 C) (Oral)   Resp 16   Ht 5' 11 (1.803 m)   Wt 199 lb 6.4 oz (90.4 kg)   SpO2 98%   BMI 27.81 kg/m  General: Awake, alert, appears stated age HEENT: AT, [place], ears patent b/l and TM's neg, nares patent w/o discharge, pharynx pink and without exudates, MMM Neck: No masses or asymmetry Heart: RRR Lungs: CTAB, no accessory muscle use Psych: Age appropriate judgment and insight, normal mood and affect  Influenza A - Plan: oseltamivir  (TAMIFLU ) 75 MG capsule  Acute cough - Plan: POCT rapid strep A, POC Covid19/Flu A&B Antigen, benzonatate  (TESSALON ) 100 MG capsule, CANCELED: POC COVID-19 BinaxNow, CANCELED: POCT Influenza A/B  Essential hypertension   Influenza A + influenza A -Rx- Tamiflu  75 mg twice daily for 5 days, Tessalon  pearls, Flonase  - Advised Tylenol  500 mg every 4-6 hours as needed for fever and body aches, May use OTC NSAIDs prn Continue to push fluids, practice good hand hygiene, cover mouth when coughing. F/u prn. If starting to experience fevers, shaking, or shortness of breath, seek immediate care. Pt voiced understanding and agreement to the plan.  HTN Bp elevated during todays visit, likely secondary to current flu symptoms.  Advised to monitor blood pressure at home and RTC BP >140/90   Roberto LITTIE Jolly, DNP, AGNP-C 01/07/25 3:58 PM

## 2025-06-23 ENCOUNTER — Encounter: Admitting: Family Medicine
